# Patient Record
Sex: Female | Born: 1980
Health system: Southern US, Community
[De-identification: ages and names within clinical notes are randomized; demographics above are authoritative.]

## PROBLEM LIST (undated history)

## (undated) DIAGNOSIS — R0789 Other chest pain: Secondary | ICD-10-CM

## (undated) DIAGNOSIS — J45909 Unspecified asthma, uncomplicated: Secondary | ICD-10-CM

## (undated) DIAGNOSIS — F419 Anxiety disorder, unspecified: Secondary | ICD-10-CM

## (undated) DIAGNOSIS — M545 Low back pain, unspecified: Secondary | ICD-10-CM

## (undated) DIAGNOSIS — F32A Depression, unspecified: Secondary | ICD-10-CM

## (undated) DIAGNOSIS — M25531 Pain in right wrist: Secondary | ICD-10-CM

## (undated) HISTORY — DX: Unspecified asthma, uncomplicated: J45.909

## (undated) HISTORY — DX: Anxiety disorder, unspecified: F41.9

## (undated) HISTORY — PX: BREAST EXCISIONAL BIOPSY: SUR124

## (undated) HISTORY — PX: BREAST SURGERY: SHX581

---

## 2005-01-13 HISTORY — PX: COLONOSCOPY: SHX174

## 2007-01-14 HISTORY — PX: BREAST EXCISIONAL BIOPSY: SUR124

## 2014-08-06 ENCOUNTER — Other Ambulatory Visit: Payer: Self-pay | Admitting: Emergency Medicine

## 2014-08-06 ENCOUNTER — Ambulatory Visit (INDEPENDENT_AMBULATORY_CARE_PROVIDER_SITE_OTHER): Payer: No Typology Code available for payment source | Admitting: Emergency Medicine

## 2014-08-06 VITALS — BP 108/66 | HR 70 | Temp 98.2°F | Resp 16 | Ht 64.0 in | Wt 119.4 lb

## 2014-08-06 DIAGNOSIS — R42 Dizziness and giddiness: Secondary | ICD-10-CM

## 2014-08-06 LAB — POCT CBC
GRANULOCYTE PERCENT: 67.7 % (ref 37–80)
HCT, POC: 38.5 % (ref 37.7–47.9)
Hemoglobin: 12.7 g/dL (ref 12.2–16.2)
Lymph, poc: 2 (ref 0.6–3.4)
MCH, POC: 30.1 pg (ref 27–31.2)
MCHC: 33 g/dL (ref 31.8–35.4)
MCV: 91.1 fL (ref 80–97)
MID (CBC): 0.5 (ref 0–0.9)
MPV: 7.7 fL (ref 0–99.8)
PLATELET COUNT, POC: 217 10*3/uL (ref 142–424)
POC Granulocyte: 5.2 (ref 2–6.9)
POC LYMPH %: 25.8 % (ref 10–50)
POC MID %: 6.5 % (ref 0–12)
RBC: 4.23 M/uL (ref 4.04–5.48)
RDW, POC: 12.9 %
WBC: 7.7 10*3/uL (ref 4.6–10.2)

## 2014-08-06 LAB — POCT URINE PREGNANCY: PREG TEST UR: NEGATIVE

## 2014-08-06 MED ORDER — MECLIZINE HCL 32 MG PO TABS: 32.0000 mg | ORAL_TABLET | Freq: Three times a day (TID) | ORAL | Status: DC | PRN

## 2014-08-06 MED ORDER — MECLIZINE HCL 25 MG PO TABS: 25.0000 mg | ORAL_TABLET | Freq: Three times a day (TID) | ORAL | Status: DC | PRN

## 2014-08-06 NOTE — Patient Instructions (Signed)

## 2014-08-06 NOTE — Progress Notes (Signed)
Subjective:    Patient ID: Lauren Castro, female    DOB: [birth year], 34 y.o.   MRN: [id number]  HPI Patient presents for dizziness that has been present since 12:00pm today and happened earlier when she first woke up. Resolved after one hour this morning, but this time won't go away. Feels off-balance and denies HA, change in vision, sinus/ear pressure, SOB, CP, palpitation. Denies trauma or recent illness. No h/o HTN, DM, migraines, seizures, or anemia. H/o asthma. Had syncopal episode 2 weeks ago following eating really rich foods. Does not believe she could be present as her and her husband use condoms. Does not smoke cigarettes or use illicit drugs. Drinks alcohol occasionally. NKDA.   Review of Systems As noted above.    Objective:   Physical Exam  Constitutional: She is oriented to person, place, and time. She appears well-developed and well-nourished. No distress.  Blood pressure 108/66, pulse 53, temperature 98.2 F (36.8 C), temperature source Oral, resp. rate 16, height  (1.626 m), weight 119 lb 6.4 oz (54.159 kg), last menstrual period 08/04/2014, SpO2 99 %.   HENT:  Head: Normocephalic and atraumatic.  Right Ear: External ear normal.  Left Ear: External ear normal.  Eyes: Conjunctivae are normal. Right eye exhibits no discharge. Left eye exhibits no discharge. No scleral icterus.  Cardiovascular: Normal rate, regular rhythm, normal heart sounds and intact distal pulses.  Exam reveals no gallop and no friction rub.   No murmur heard. Pulmonary/Chest: Effort normal and breath sounds normal. No respiratory distress. She has no wheezes. She has no rales.  Abdominal: Soft. Bowel sounds are normal. She exhibits no distension. There is no tenderness. There is no rebound and no guarding.  Musculoskeletal: She exhibits no edema.  Neurological: She is alert and oriented to person, place, and time.  Negative Dix-Halpike  Skin: Skin is warm and dry. No rash noted. She is not  diaphoretic. No erythema. No pallor.  Psychiatric: She has a normal mood and affect. Her behavior is normal. Judgment and thought content normal.   Results for orders placed or performed in visit on 08/06/14  POCT CBC  Result Value Ref Range   WBC 7.7 4.6 - 10.2 K/uL   Lymph, poc 2.0 0.6 - 3.4   POC LYMPH PERCENT 25.8 10 - 50 %L   MID (cbc) 0.5 0 - 0.9   POC MID % 6.5 0 - 12 %M   POC Granulocyte 5.2 2 - 6.9   Granulocyte percent 67.7 37 - 80 %G   RBC 4.23 4.04 - 5.48 M/uL   Hemoglobin 12.7 12.2 - 16.2 g/dL   HCT, POC 40.9 81.1 - 47.9 %   MCV 91.1 80 - 97 fL   MCH, POC 30.1 27 - 31.2 pg   MCHC 33.0 31.8 - 35.4 g/dL   RDW, POC 91.4 %   Platelet Count, POC 217 142 - 424 K/uL   MPV 7.7 0 - 99.8 fL  POCT urine pregnancy  Result Value Ref Range   Preg Test, Ur Negative Negative   EKG with Dr. Cleta Alberts: sinus bradycardia.     Assessment & Plan:  1. Dizziness Labs and EKG reassuring. If not findings with cardiology and sx persist can send for neuro eval. - POCT CBC - POCT urine pregnancy - Comprehensive metabolic panel - EKG 12-Lead - meclizine (ANTIVERT) 32 MG tablet; Take 1 tablet (32 mg total) by mouth 3 (three) times daily as needed.  Dispense: 30 tablet; Refill: 0 - Ambulatory referral  to Cardiology   Janan Ridge PA-C  [facility] 08/06/2014 4:28 PM

## 2014-08-07 ENCOUNTER — Telehealth: Payer: Self-pay | Admitting: Physician Assistant

## 2014-08-07 LAB — COMPREHENSIVE METABOLIC PANEL
ALT: 11 U/L (ref 6–29)
AST: 16 U/L (ref 10–30)
Albumin: 4.6 g/dL (ref 3.6–5.1)
Alkaline Phosphatase: 44 U/L (ref 33–115)
BILIRUBIN TOTAL: 0.4 mg/dL (ref 0.2–1.2)
BUN: 14 mg/dL (ref 7–25)
CO2: 27 mEq/L (ref 20–31)
CREATININE: 0.71 mg/dL (ref 0.50–1.10)
Calcium: 9.3 mg/dL (ref 8.6–10.2)
Chloride: 100 mEq/L (ref 98–110)
Glucose, Bld: 90 mg/dL (ref 65–99)
Potassium: 3.9 mEq/L (ref 3.5–5.3)
Sodium: 137 mEq/L (ref 135–146)
TOTAL PROTEIN: 7.4 g/dL (ref 6.1–8.1)

## 2014-08-07 NOTE — Telephone Encounter (Signed)
Left vmail to check on sx and to relay normal lab values.

## 2014-09-29 ENCOUNTER — Ambulatory Visit: Payer: Self-pay | Admitting: Internal Medicine

## 2015-06-15 DIAGNOSIS — M9901 Segmental and somatic dysfunction of cervical region: Secondary | ICD-10-CM | POA: Diagnosis not present

## 2015-06-15 DIAGNOSIS — M9902 Segmental and somatic dysfunction of thoracic region: Secondary | ICD-10-CM | POA: Diagnosis not present

## 2015-06-15 DIAGNOSIS — M7541 Impingement syndrome of right shoulder: Secondary | ICD-10-CM | POA: Diagnosis not present

## 2015-06-15 DIAGNOSIS — R51 Headache: Secondary | ICD-10-CM | POA: Diagnosis not present

## 2015-06-15 DIAGNOSIS — M542 Cervicalgia: Secondary | ICD-10-CM | POA: Diagnosis not present

## 2015-06-15 DIAGNOSIS — M9907 Segmental and somatic dysfunction of upper extremity: Secondary | ICD-10-CM | POA: Diagnosis not present

## 2015-06-29 DIAGNOSIS — M9901 Segmental and somatic dysfunction of cervical region: Secondary | ICD-10-CM | POA: Diagnosis not present

## 2015-06-29 DIAGNOSIS — M9902 Segmental and somatic dysfunction of thoracic region: Secondary | ICD-10-CM | POA: Diagnosis not present

## 2015-06-29 DIAGNOSIS — R51 Headache: Secondary | ICD-10-CM | POA: Diagnosis not present

## 2015-06-29 DIAGNOSIS — M7541 Impingement syndrome of right shoulder: Secondary | ICD-10-CM | POA: Diagnosis not present

## 2015-06-29 DIAGNOSIS — M542 Cervicalgia: Secondary | ICD-10-CM | POA: Diagnosis not present

## 2015-07-06 DIAGNOSIS — Z13220 Encounter for screening for lipoid disorders: Secondary | ICD-10-CM | POA: Diagnosis not present

## 2015-07-06 DIAGNOSIS — Z0000 Encounter for general adult medical examination without abnormal findings: Secondary | ICD-10-CM | POA: Diagnosis not present

## 2015-07-06 DIAGNOSIS — Z131 Encounter for screening for diabetes mellitus: Secondary | ICD-10-CM | POA: Diagnosis not present

## 2015-08-03 DIAGNOSIS — M7541 Impingement syndrome of right shoulder: Secondary | ICD-10-CM | POA: Diagnosis not present

## 2015-08-03 DIAGNOSIS — M9901 Segmental and somatic dysfunction of cervical region: Secondary | ICD-10-CM | POA: Diagnosis not present

## 2015-08-03 DIAGNOSIS — M542 Cervicalgia: Secondary | ICD-10-CM | POA: Diagnosis not present

## 2015-08-03 DIAGNOSIS — R51 Headache: Secondary | ICD-10-CM | POA: Diagnosis not present

## 2015-09-07 DIAGNOSIS — M7541 Impingement syndrome of right shoulder: Secondary | ICD-10-CM | POA: Diagnosis not present

## 2015-09-07 DIAGNOSIS — M9902 Segmental and somatic dysfunction of thoracic region: Secondary | ICD-10-CM | POA: Diagnosis not present

## 2015-09-07 DIAGNOSIS — M9901 Segmental and somatic dysfunction of cervical region: Secondary | ICD-10-CM | POA: Diagnosis not present

## 2015-09-07 DIAGNOSIS — M542 Cervicalgia: Secondary | ICD-10-CM | POA: Diagnosis not present

## 2015-09-07 DIAGNOSIS — R51 Headache: Secondary | ICD-10-CM | POA: Diagnosis not present

## 2015-10-12 DIAGNOSIS — M7541 Impingement syndrome of right shoulder: Secondary | ICD-10-CM | POA: Diagnosis not present

## 2015-10-12 DIAGNOSIS — M542 Cervicalgia: Secondary | ICD-10-CM | POA: Diagnosis not present

## 2015-10-12 DIAGNOSIS — M9902 Segmental and somatic dysfunction of thoracic region: Secondary | ICD-10-CM | POA: Diagnosis not present

## 2015-10-12 DIAGNOSIS — R51 Headache: Secondary | ICD-10-CM | POA: Diagnosis not present

## 2015-10-12 DIAGNOSIS — M9901 Segmental and somatic dysfunction of cervical region: Secondary | ICD-10-CM | POA: Diagnosis not present

## 2016-01-21 DIAGNOSIS — M9905 Segmental and somatic dysfunction of pelvic region: Secondary | ICD-10-CM | POA: Diagnosis not present

## 2016-01-21 DIAGNOSIS — M9903 Segmental and somatic dysfunction of lumbar region: Secondary | ICD-10-CM | POA: Diagnosis not present

## 2016-01-21 DIAGNOSIS — M62830 Muscle spasm of back: Secondary | ICD-10-CM | POA: Diagnosis not present

## 2016-01-21 DIAGNOSIS — M9902 Segmental and somatic dysfunction of thoracic region: Secondary | ICD-10-CM | POA: Diagnosis not present

## 2016-01-21 DIAGNOSIS — M791 Myalgia: Secondary | ICD-10-CM | POA: Diagnosis not present

## 2016-01-28 DIAGNOSIS — M9902 Segmental and somatic dysfunction of thoracic region: Secondary | ICD-10-CM | POA: Diagnosis not present

## 2016-01-28 DIAGNOSIS — M9903 Segmental and somatic dysfunction of lumbar region: Secondary | ICD-10-CM | POA: Diagnosis not present

## 2016-01-28 DIAGNOSIS — M791 Myalgia: Secondary | ICD-10-CM | POA: Diagnosis not present

## 2016-01-28 DIAGNOSIS — M62830 Muscle spasm of back: Secondary | ICD-10-CM | POA: Diagnosis not present

## 2016-01-28 DIAGNOSIS — M9905 Segmental and somatic dysfunction of pelvic region: Secondary | ICD-10-CM | POA: Diagnosis not present

## 2016-02-06 DIAGNOSIS — M9902 Segmental and somatic dysfunction of thoracic region: Secondary | ICD-10-CM | POA: Diagnosis not present

## 2016-02-06 DIAGNOSIS — M9903 Segmental and somatic dysfunction of lumbar region: Secondary | ICD-10-CM | POA: Diagnosis not present

## 2016-02-06 DIAGNOSIS — M9905 Segmental and somatic dysfunction of pelvic region: Secondary | ICD-10-CM | POA: Diagnosis not present

## 2016-02-06 DIAGNOSIS — M791 Myalgia: Secondary | ICD-10-CM | POA: Diagnosis not present

## 2016-02-06 DIAGNOSIS — M62830 Muscle spasm of back: Secondary | ICD-10-CM | POA: Diagnosis not present

## 2016-02-27 DIAGNOSIS — M791 Myalgia: Secondary | ICD-10-CM | POA: Diagnosis not present

## 2016-02-27 DIAGNOSIS — M9902 Segmental and somatic dysfunction of thoracic region: Secondary | ICD-10-CM | POA: Diagnosis not present

## 2016-02-27 DIAGNOSIS — M9905 Segmental and somatic dysfunction of pelvic region: Secondary | ICD-10-CM | POA: Diagnosis not present

## 2016-02-27 DIAGNOSIS — M62830 Muscle spasm of back: Secondary | ICD-10-CM | POA: Diagnosis not present

## 2016-02-27 DIAGNOSIS — M9903 Segmental and somatic dysfunction of lumbar region: Secondary | ICD-10-CM | POA: Diagnosis not present

## 2016-03-27 DIAGNOSIS — H8309 Labyrinthitis, unspecified ear: Secondary | ICD-10-CM | POA: Diagnosis not present

## 2016-04-02 DIAGNOSIS — R42 Dizziness and giddiness: Secondary | ICD-10-CM | POA: Diagnosis not present

## 2016-04-22 ENCOUNTER — Ambulatory Visit (INDEPENDENT_AMBULATORY_CARE_PROVIDER_SITE_OTHER): Payer: BLUE CROSS/BLUE SHIELD | Admitting: Neurology

## 2016-04-22 ENCOUNTER — Encounter (INDEPENDENT_AMBULATORY_CARE_PROVIDER_SITE_OTHER): Payer: Self-pay

## 2016-04-22 ENCOUNTER — Encounter: Payer: Self-pay | Admitting: Neurology

## 2016-04-22 VITALS — BP 114/77 | HR 72 | Ht 63.5 in | Wt 119.2 lb

## 2016-04-22 DIAGNOSIS — R42 Dizziness and giddiness: Secondary | ICD-10-CM | POA: Diagnosis not present

## 2016-04-22 DIAGNOSIS — H8309 Labyrinthitis, unspecified ear: Secondary | ICD-10-CM | POA: Diagnosis not present

## 2016-04-22 MED ORDER — PREDNISONE 20 MG PO TABS: 60.0000 mg | ORAL_TABLET | Freq: Every day | ORAL | 1 refills | Status: DC

## 2016-04-22 NOTE — Patient Instructions (Signed)
Remember to drink plenty of fluid, eat healthy meals and do not skip any meals. Try to eat protein with a every meal and eat a healthy snack such as fruit or nuts in between meals. Try to keep a regular sleep-wake schedule and try to exercise daily, particularly in the form of walking, 20-30 minutes a day, if you can.   As far as your medications are concerned, I would like to suggest:  Prednisone for 5-7 days Vestibular therapy Epley Maneuvers daily  As far as diagnostic testing: Consider MRI brain if you do not improve  I would like to see you back if needed, sooner if we need to. Please call us with any interim questions, concerns, problems, updates or refill requests.   Our phone number is [phone]. We also have an after hours call service for urgent matters and there is a physician on-call for urgent questions. For any emergencies you know to call 911 or go to the nearest emergency room

## 2016-04-22 NOTE — Progress Notes (Signed)
[facility]    Provider:  Dr Lucia Gaskins Referring Provider: Farris Has, MD Primary Care Physician:  Farris Has, MD  CC: Dizziness, vertigo  HPI:  Lauren Castro is a 36 y.o. female here as a referral from Dr. Kateri Plummer for dizziness, vertigo and nausea. Started a month ago. This happened in 2012 as well. In 2012 she noticed when she turned left that she felt slight dizziness and the next morning she had room spinning and she couldn't focus or keep her balance with nausea. This time started about a month ago, not as bad as bad as 2012, no room spinning an off balance feeling. She has a floating feeling. Like on boat. Worse with motion. Worse with a sudden turn to either side. A crowded store and lights made her dizzy. Feeling hot may trigger it. Breathing deeply helps. Sitting still will help. Frequent changes of direction worsen it. Episodic daily, lasts a minute. She was not feeling well a week before.   Reviewed notes, labs and imaging from outside physicians, which showed:  Review primary care notes. Patient complaining of vertigo. Seen 1 week ago by coworker tried meclizine for 2 days which helped with the nausea but made her sleepy. Stop the medication after the nausea resolved. Vertigo overtime as gotten better but still present. Started several weeks ago with feeling off balance at times not a spinning sensation more of a balance or delays aggravated by motion, physical tasks. No vomiting or numbness or weakness. No vertigo twice in the past. Worst time was initially 02/01/2010. No chest pain or dyspnea. Exam was normal.   Reviewed labs including BMP and CBC were both normal with creatinine 0.72 and BUN 14.  Review of Systems: Patient complains of symptoms per HPI as well as the following symptoms: no CP, no SOB. Pertinent negatives per HPI. All others negative.   Social History   Social History  . Marital status: Married    Spouse name: N/A  . Number of children: 0    . Years of education: MA   Occupational History  . [employer]    Social History Main Topics  . Smoking status: Never Smoker  . Smokeless tobacco: Never Used  . Alcohol use 3.0 - 4.2 oz/week    5 - 7 Glasses of wine per week     Comment: 1 glass of wine daily  . Drug use: No  . Sexual activity: Yes    Birth control/ protection: Condom   Other Topics Concern  . Not on file   Social History Narrative   Lives at home w/ her husband   Right-handed   Caffeine: 1-2 cups per day    Family History  Problem Relation Age of Onset  . Stroke Neg Hx     Past Medical History:  Diagnosis Date  . Anxiety   . Asthma     Past Surgical History:  Procedure Laterality Date  . BREAST SURGERY      Current Outpatient Prescriptions  Medication Sig Dispense Refill  . ibuprofen (ADVIL,MOTRIN) 200 MG tablet Take 400 mg by mouth every 8 (eight) hours as needed.    . predniSONE (DELTASONE) 20 MG tablet Take 3 tablets (60 mg total) by mouth daily. 21 tablet 1   No current facility-administered medications for this visit.     Allergies as of 04/22/2016  . (No Known Allergies)    Vitals: BP 114/77 (Patient Position: Standing)   Pulse 72   Ht 5' 3.5" (1.613 m)  Wt 119 lb 3.2 oz (54.1 kg)   BMI 20.78 kg/m  Last Weight:  Wt Readings from Last 1 Encounters:  04/22/16 119 lb 3.2 oz (54.1 kg)   Last Height:   Ht Readings from Last 1 Encounters:  04/22/16 5' 3.5" (1.613 m)    Physical exam: Exam: Gen: NAD, conversant, well nourised, well groomed                     CV: RRR, no MRG. No Carotid Bruits. No peripheral edema, warm, nontender Eyes: Conjunctivae clear without exudates or hemorrhage  Neuro: Detailed Neurologic Exam  Speech:    Speech is normal; fluent and spontaneous with normal comprehension.  Cognition:    The patient is oriented to person, place, and time;     recent and remote memory intact;     language fluent;     normal attention, concentration,     fund of  knowledge Cranial Nerves:    The pupils are equal, round, and reactive to light. The fundi are normal and spontaneous venous pulsations are present. Visual fields are full to finger confrontation. Extraocular movements are intact. Trigeminal sensation is intact and the muscles of mastication are normal. The face is symmetric. The palate elevates in the midline. Hearing intact. Voice is normal. Shoulder shrug is normal. The tongue has normal motion without fasciculations.   Coordination:    Normal finger to nose and heel to shin. Normal rapid alternating movements.   Gait:    Heel-toe and tandem gait are normal.   Motor Observation:    No asymmetry, no atrophy, and no involuntary movements noted. Tone:    Normal muscle tone.    Posture:    Posture is normal. normal erect    Strength:    Strength is V/V in the upper and lower limbs.      Sensation: intact to LT     Reflex Exam:  DTR's:    Deep tendon reflexes in the upper and lower extremities are normal bilaterally.   Toes:    The toes are downgoing bilaterally.   Clonus:    Clonus is absent.      Assessment/Plan:  Very lovely 36 year old with vertigo and dizziness. May be BPPV vs Vestibular Neuritis. Will give her a course of steroids and refer to Vestibular therapy. Recommended MRI but she declined, she will call back if symptoms do not improve.    prednisone x 7 Vestibular therapy Exercises at home, Epley maneuvers  Cc: Dr. Grafton Folk, MD  [facility] [address] [place], [place] [address]  Phone [phone] Fax [phone]

## 2016-04-24 ENCOUNTER — Encounter: Payer: Self-pay | Admitting: Neurology

## 2016-09-25 ENCOUNTER — Other Ambulatory Visit: Payer: Self-pay | Admitting: Family Medicine

## 2016-09-25 ENCOUNTER — Other Ambulatory Visit (HOSPITAL_COMMUNITY)
Admission: RE | Admit: 2016-09-25 | Discharge: 2016-09-25 | Disposition: A | Discharge status: Home / Self Care | Payer: BLUE CROSS/BLUE SHIELD | Source: Ambulatory Visit | Attending: Family Medicine | Admitting: Family Medicine

## 2016-09-25 DIAGNOSIS — Z124 Encounter for screening for malignant neoplasm of cervix: Secondary | ICD-10-CM | POA: Diagnosis not present

## 2016-09-25 DIAGNOSIS — Z01419 Encounter for gynecological examination (general) (routine) without abnormal findings: Secondary | ICD-10-CM | POA: Insufficient documentation

## 2016-09-25 DIAGNOSIS — Z23 Encounter for immunization: Secondary | ICD-10-CM | POA: Diagnosis not present

## 2016-09-25 DIAGNOSIS — Z0000 Encounter for general adult medical examination without abnormal findings: Secondary | ICD-10-CM | POA: Diagnosis not present

## 2016-09-26 LAB — CYTOLOGY - PAP: DIAGNOSIS: NEGATIVE

## 2016-10-03 DIAGNOSIS — M62830 Muscle spasm of back: Secondary | ICD-10-CM | POA: Diagnosis not present

## 2016-10-03 DIAGNOSIS — M9905 Segmental and somatic dysfunction of pelvic region: Secondary | ICD-10-CM | POA: Diagnosis not present

## 2016-10-03 DIAGNOSIS — M9902 Segmental and somatic dysfunction of thoracic region: Secondary | ICD-10-CM | POA: Diagnosis not present

## 2016-10-03 DIAGNOSIS — M9903 Segmental and somatic dysfunction of lumbar region: Secondary | ICD-10-CM | POA: Diagnosis not present

## 2016-10-03 DIAGNOSIS — M791 Myalgia: Secondary | ICD-10-CM | POA: Diagnosis not present

## 2016-11-07 DIAGNOSIS — M791 Myalgia, unspecified site: Secondary | ICD-10-CM | POA: Diagnosis not present

## 2016-11-07 DIAGNOSIS — M9905 Segmental and somatic dysfunction of pelvic region: Secondary | ICD-10-CM | POA: Diagnosis not present

## 2016-11-07 DIAGNOSIS — M9902 Segmental and somatic dysfunction of thoracic region: Secondary | ICD-10-CM | POA: Diagnosis not present

## 2016-11-07 DIAGNOSIS — M62830 Muscle spasm of back: Secondary | ICD-10-CM | POA: Diagnosis not present

## 2016-11-07 DIAGNOSIS — M9903 Segmental and somatic dysfunction of lumbar region: Secondary | ICD-10-CM | POA: Diagnosis not present

## 2016-12-12 DIAGNOSIS — M9905 Segmental and somatic dysfunction of pelvic region: Secondary | ICD-10-CM | POA: Diagnosis not present

## 2016-12-12 DIAGNOSIS — M9903 Segmental and somatic dysfunction of lumbar region: Secondary | ICD-10-CM | POA: Diagnosis not present

## 2016-12-12 DIAGNOSIS — M9902 Segmental and somatic dysfunction of thoracic region: Secondary | ICD-10-CM | POA: Diagnosis not present

## 2016-12-12 DIAGNOSIS — M62830 Muscle spasm of back: Secondary | ICD-10-CM | POA: Diagnosis not present

## 2016-12-12 DIAGNOSIS — M791 Myalgia, unspecified site: Secondary | ICD-10-CM | POA: Diagnosis not present

## 2017-01-16 DIAGNOSIS — M9905 Segmental and somatic dysfunction of pelvic region: Secondary | ICD-10-CM | POA: Diagnosis not present

## 2017-01-16 DIAGNOSIS — M9902 Segmental and somatic dysfunction of thoracic region: Secondary | ICD-10-CM | POA: Diagnosis not present

## 2017-01-16 DIAGNOSIS — M791 Myalgia, unspecified site: Secondary | ICD-10-CM | POA: Diagnosis not present

## 2017-01-16 DIAGNOSIS — M62830 Muscle spasm of back: Secondary | ICD-10-CM | POA: Diagnosis not present

## 2017-01-16 DIAGNOSIS — M9903 Segmental and somatic dysfunction of lumbar region: Secondary | ICD-10-CM | POA: Diagnosis not present

## 2017-02-20 DIAGNOSIS — M62830 Muscle spasm of back: Secondary | ICD-10-CM | POA: Diagnosis not present

## 2017-02-20 DIAGNOSIS — M9902 Segmental and somatic dysfunction of thoracic region: Secondary | ICD-10-CM | POA: Diagnosis not present

## 2017-02-20 DIAGNOSIS — M791 Myalgia, unspecified site: Secondary | ICD-10-CM | POA: Diagnosis not present

## 2017-02-20 DIAGNOSIS — M9905 Segmental and somatic dysfunction of pelvic region: Secondary | ICD-10-CM | POA: Diagnosis not present

## 2017-02-20 DIAGNOSIS — M9903 Segmental and somatic dysfunction of lumbar region: Secondary | ICD-10-CM | POA: Diagnosis not present

## 2017-04-01 DIAGNOSIS — R5383 Other fatigue: Secondary | ICD-10-CM | POA: Diagnosis not present

## 2017-04-01 DIAGNOSIS — J069 Acute upper respiratory infection, unspecified: Secondary | ICD-10-CM | POA: Diagnosis not present

## 2017-04-20 DIAGNOSIS — D729 Disorder of white blood cells, unspecified: Secondary | ICD-10-CM | POA: Diagnosis not present

## 2017-09-30 DIAGNOSIS — M25531 Pain in right wrist: Secondary | ICD-10-CM | POA: Diagnosis not present

## 2017-09-30 DIAGNOSIS — Z23 Encounter for immunization: Secondary | ICD-10-CM | POA: Diagnosis not present

## 2017-09-30 DIAGNOSIS — Z0000 Encounter for general adult medical examination without abnormal findings: Secondary | ICD-10-CM | POA: Diagnosis not present

## 2017-09-30 DIAGNOSIS — Z136 Encounter for screening for cardiovascular disorders: Secondary | ICD-10-CM | POA: Diagnosis not present

## 2017-09-30 DIAGNOSIS — D729 Disorder of white blood cells, unspecified: Secondary | ICD-10-CM | POA: Diagnosis not present

## 2017-10-19 DIAGNOSIS — L814 Other melanin hyperpigmentation: Secondary | ICD-10-CM | POA: Diagnosis not present

## 2017-10-19 DIAGNOSIS — B360 Pityriasis versicolor: Secondary | ICD-10-CM | POA: Diagnosis not present

## 2017-10-19 DIAGNOSIS — D225 Melanocytic nevi of trunk: Secondary | ICD-10-CM | POA: Diagnosis not present

## 2017-10-19 DIAGNOSIS — L304 Erythema intertrigo: Secondary | ICD-10-CM | POA: Diagnosis not present

## 2017-12-03 DIAGNOSIS — M25531 Pain in right wrist: Secondary | ICD-10-CM | POA: Diagnosis not present

## 2017-12-14 DIAGNOSIS — M25531 Pain in right wrist: Secondary | ICD-10-CM | POA: Diagnosis not present

## 2017-12-14 DIAGNOSIS — M25331 Other instability, right wrist: Secondary | ICD-10-CM | POA: Diagnosis not present

## 2018-01-11 DIAGNOSIS — M25331 Other instability, right wrist: Secondary | ICD-10-CM | POA: Diagnosis not present

## 2018-02-03 DIAGNOSIS — M25331 Other instability, right wrist: Secondary | ICD-10-CM | POA: Diagnosis not present

## 2018-07-29 DIAGNOSIS — M545 Low back pain: Secondary | ICD-10-CM | POA: Diagnosis not present

## 2018-08-03 ENCOUNTER — Ambulatory Visit: Payer: BC Managed Care – PPO | Attending: Family Medicine

## 2018-08-03 ENCOUNTER — Other Ambulatory Visit: Payer: Self-pay

## 2018-08-03 DIAGNOSIS — M545 Low back pain, unspecified: Secondary | ICD-10-CM

## 2018-08-03 DIAGNOSIS — R252 Cramp and spasm: Secondary | ICD-10-CM

## 2018-08-03 DIAGNOSIS — R293 Abnormal posture: Secondary | ICD-10-CM | POA: Diagnosis not present

## 2018-08-03 DIAGNOSIS — G8929 Other chronic pain: Secondary | ICD-10-CM | POA: Insufficient documentation

## 2018-08-03 NOTE — Patient Instructions (Addendum)
Trigger Point Dry Needling  . What is Trigger Point Dry Needling (DN)? o DN is a physical therapy technique used to treat muscle pain and dysfunction. Specifically, DN helps deactivate muscle trigger points (muscle knots).  o A thin filiform needle is used to penetrate the skin and stimulate the underlying trigger point. The goal is for a local twitch response (LTR) to occur and for the trigger point to relax. No medication of any kind is injected during the procedure.   . What Does Trigger Point Dry Needling Feel Like?  o The procedure feels different for each individual patient. Some patients report that they do not actually feel the needle enter the skin and overall the process is not painful. Very mild bleeding may occur. However, many patients feel a deep cramping in the muscle in which the needle was inserted. This is the local twitch response.   Marland Kitchen How Will I feel after the treatment? o Soreness is normal, and the onset of soreness may not occur for a few hours. Typically this soreness does not last longer than two days.  o Bruising is uncommon, however; ice can be used to decrease any possible bruising.  o In rare cases feeling tired or nauseous after the treatment is normal. In addition, your symptoms may get worse before they get better, this period will typically not last longer than 24 hours.   . What Can I do After My Treatment? o Increase your hydration by drinking more water for the next 24 hours. o You may place ice or heat on the areas treated that have become sore, however, do not use heat on inflamed or bruised areas. Heat often brings more relief post needling. o You can continue your regular activities, but vigorous activity is not recommended initially after the treatment for 24 hours. o DN is best combined with other physical therapy such as strengthening, stretching, and other therapies.   Cervico-Thoracic: Extension / Rotation (Sitting)    Reach across body with left  arm and grasp back of chair. Gently look over right side shoulder. Hold ___20_ seconds. Relax. Repeat _3___ times per set. Do ____ sets per session. Do __3__ sessions per day.  [email or web address]   Access Code: [email or web address]  URL: [email or web address]  Date: 08/03/2018  Prepared by: Sigurd Sos   Exercises  Supine Lower Trunk Rotation - 3 reps - 1 sets - 20 hold - 3x daily - 7x weekly  Sidelying Open Book Thoracic Rotation with Knee on Foam Roll - 10 reps - 1 sets - 5 hold - 3x daily - 7x weekly  Supine Piriformis Stretch - 3 reps - 1 sets - 3x daily - 7x weekly  Seated Piriformis Stretch with Trunk Bend - 3 reps - 1 sets - 20 hold - 3x daily - 7x weekly  Patient Education  Office Posture   Copyright  VHI. All rights reserved.  [facility] [address], [place], [place] [address] Phone # [phone] Fax [phone]

## 2018-08-03 NOTE — Therapy (Signed)
[facility] [address] [place], [place], [address] Phone: [phone]   Fax:  [phone]  Physical Therapy Evaluation  Patient Details  Name: Lauren Castro MRN: [id number] Date of Birth: May 26, 1980 Referring Provider (PT): London Pepper, MD   Encounter Date: 08/03/2018  PT End of Session - 08/03/18 0820    Visit Number  1    Date for PT Re-Evaluation  09/28/18    Authorization Type  BCBS    PT Start Time  0731    PT Stop Time  0823    PT Time Calculation (min)  52 min    Activity Tolerance  Patient tolerated treatment well    Behavior During Therapy  [facility] for tasks assessed/performed       Past Medical History:  Diagnosis Date  . Anxiety   . Asthma     Past Surgical History:  Procedure Laterality Date  . BREAST SURGERY      There were no vitals filed for this visit.   Subjective Assessment - 08/03/18 0735    Subjective  Pt presents to PT with Rt sided LBP that began in April when she was moving heavy rocks.  Pain resolved and then returned in June after twisting during exercise.    Limitations  Sitting;Standing    How long can you sit comfortably?  on a hard chair- limited to 2-3 hours    How long can you stand comfortably?  1-2 hours    How long can you walk comfortably?  some pain, not limited    Diagnostic tests  none    Patient Stated Goals  reduce pain, stand and sit longer, run again    Currently in Pain?  Yes    Pain Score  4    4-5/10 max   Pain Location  Back    Pain Orientation  Right    Pain Descriptors / Indicators  Sharp;Sore    Pain Type  Acute pain    Pain Onset  More than a month ago    Pain Frequency  Constant    Aggravating Factors   sitting, standing, exercise    Pain Relieving Factors  stop the aggravating activity, stretching         [facility] PT Assessment - 08/03/18 0001      Assessment   Medical Diagnosis  Rt low back pain    Referring Provider (PT)  London Pepper, MD     Onset Date/Surgical Date  04/20/18    Prior Therapy  none      Precautions   Precautions  None      Restrictions   Weight Bearing Restrictions  No      Balance Screen   Has the patient fallen in the past 6 months  No    Has the patient had a decrease in activity level because of a fear of falling?   No    Is the patient reluctant to leave their home because of a fear of falling?   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  Pt reports Rt lumbar pain with Rt sidebending.       PROM   Overall PROM   Within functional limits for tasks performed    Overall PROM Comments  no pain      Strength   Overall Strength  Deficits    Overall Strength Comments  hip and lumbar strength is full except hip flexion is 4/5 bilaterally      Palpation   Spinal mobility  reduced PA mobility T10-12, L3-5 with pain L3-5    Palpation comment  tension and trigger points Rt quadratus, tension over Rt lumbar paraspinals      Transfers   Transfers  Independent with all Transfers      Ambulation/Gait   Ambulation/Gait  Yes    Gait Pattern  Within Functional Limits                Objective measurements  completed on examination: See above findings.        Trigger Point Dry Needling - 08/03/18 0001    Consent Given?  Yes    Education Handout Provided  Yes    Muscles Treated Back/Hip  Lumbar multifidi   bil L1-5   Lumbar multifidi Response  Twitch response elicited;Palpable increased muscle length           PT Education - 08/03/18 0837    Education Details  Access Code: [email or web address] , Dry needling information    Person(s) Educated  Patient    Methods  Explanation;Demonstration;Handout    Comprehension  Verbalized understanding;Returned demonstration       PT Short Term Goals - 08/03/18 0743      PT SHORT TERM GOAL #1   Title  be independent in initial HEP    Time  4    Period  Weeks    Status  New    Target Date  08/31/18      PT SHORT TERM GOAL #2   Title  report < or = to 2/10 LBP with sitting and standing for work    Time  4    Period  Weeks    Status  New    Target Date  08/31/18      PT SHORT TERM GOAL #3   Title  report frequent change of postion and postural corrections with desk work    Time  4    Period  Weeks    Status  New    Target Date  08/31/18        PT Long Term Goals - 08/03/18 0834      PT LONG TERM GOAL #1   Title  be independent in advanced HEP    Time  8    Period  Weeks    Status  New    Target Date  09/28/18      PT LONG TERM GOAL #2   Title  reduce FOTO to <  or = to 21% limitation    Time  8    Period  Weeks    Status  New    Target Date  09/28/18      PT LONG TERM GOAL #3   Title  return to regular exercise routine without limitation due to LBP    Time  8    Period  Weeks    Status  New    Target Date  09/28/18      PT LONG TERM GOAL #4   Title  return to regular running without increased LBP    Time  8    Period  Weeks    Status  New    Target Date  09/28/18      PT LONG TERM GOAL #5   Title  report a 75% reduction in LBP with work tasks    Time  8    Period  Weeks    Status  New    Target Date  09/28/18              Plan - 08/03/18 0854    Clinical Impression Statement  Pt presents to PT with Rt sided lumbar pain that began in April after moving heavy stones in her yard.  This pain resolved and then returned when she was performing a twisting exercise when exercising.  Pt now reports 4-5/10 Rt sided LBP that is present with long periods of standing and sitting.  Pt has not been running due to pain.  Pt demonstrates full lumbar A/ROM with Rt sided lumbar pain with Rt sidebending.  Pt with reduced PA mobility T10-12 and L3-5 with pain at the lumbar segments and Rt paraspinals.  Pt also with tenderness over the Rt quadratus lumborum.  Pt will benefit from skilled PT for dry needling, manual therapy, hip and core strength and flexibility to allow for return to function without pain.    Examination-Activity Limitations  Stand;Sit    Stability/Clinical Decision Making  Stable/Uncomplicated    Clinical Decision Making  Low    Rehab Potential  Excellent    PT Frequency  2x / week    PT Duration  8 weeks    PT Treatment/Interventions  ADLs/Self Care Home Management;Cryotherapy;Electrical Stimulation;Moist Heat;Functional mobility training;Therapeutic activities;Therapeutic exercise;Neuromuscular re-education;Manual techniques;Patient/family education;Passive range of motion;Dry needling;Taping;Spinal Manipulations;Joint Manipulations    PT Next Visit Plan  assess response to dry needling. Repeat and add needling to quadratus.  Core strength, hip strength    PT Home Exercise Plan  Access Code: [email or web address]       Patient will benefit from skilled therapeutic intervention in order to improve the following deficits and impairments:  Decreased activity tolerance, Decreased strength, Impaired flexibility, Increased muscle spasms, Improper body mechanics, Postural dysfunction  Visit Diagnosis: 1. Chronic right-sided low back pain without sciatica   2. Abnormal posture   3. Cramp and spasm        Problem  List There are no active problems to display for this patient.   Lauren Castro, PT 08/03/18 9:08 AM  [facility] [address] [place], [place], [address] Phone: [phone]   Fax:  [phone]  Name: Lauren Castro MRN: [id number] Date of Birth: 04/22/1980

## 2018-08-16 ENCOUNTER — Other Ambulatory Visit: Payer: Self-pay

## 2018-08-16 ENCOUNTER — Ambulatory Visit: Payer: BC Managed Care – PPO | Attending: Family Medicine

## 2018-08-16 DIAGNOSIS — G8929 Other chronic pain: Secondary | ICD-10-CM | POA: Diagnosis not present

## 2018-08-16 DIAGNOSIS — R293 Abnormal posture: Secondary | ICD-10-CM | POA: Insufficient documentation

## 2018-08-16 DIAGNOSIS — M545 Low back pain, unspecified: Secondary | ICD-10-CM

## 2018-08-16 DIAGNOSIS — R252 Cramp and spasm: Secondary | ICD-10-CM | POA: Insufficient documentation

## 2018-08-16 NOTE — Patient Instructions (Signed)
Access Code: [email or web address]  URL: [email or web address]  Date: 08/16/2018  Prepared by: Sigurd Sos   Exercises  Bridge with Abduction and Resistance Loop - 10 reps - 1x daily - 7x weekly Clamshell with Resistance - 10 reps - 2 sets - 1x daily - 7x weekly Patient Education Office Posture

## 2018-08-16 NOTE — Therapy (Addendum)
[facility] [address] [place], [place], [address] Phone: [phone]   Fax:  [phone]  Physical Therapy Treatment  Patient Details  Name: Lauren Castro MRN: [id number] Date of Birth: Jul 15, 1980 Referring Provider (PT): London Pepper, MD   Encounter Date: 08/16/2018  PT End of Session - 08/16/18 1528    Visit Number  2    Date for PT Re-Evaluation  09/28/18    Authorization Type  BCBS    PT Start Time  1446    PT Stop Time  1526    PT Time Calculation (min)  40 min    Activity Tolerance  Patient tolerated treatment well    Behavior During Therapy  [facility] for tasks assessed/performed       Past Medical History:  Diagnosis Date  . Anxiety   . Asthma     Past Surgical History:  Procedure Laterality Date  . BREAST SURGERY      There were no vitals filed for this visit.  Subjective Assessment - 08/16/18 1447    Subjective  I did my homework everyday for a week. The needling helped the low back.  Still with Rt>Lt gluteal and low back pain.    Patient Stated Goals  reduce pain, stand and sit longer, run again    Currently in Pain?  Yes    Pain Score  3     Pain Location  Back    Pain Orientation  Right;Left    Pain Descriptors / Indicators  Tightness;Sore    Pain Type  Acute pain    Pain Onset  More than a month ago    Pain Frequency  Constant    Aggravating Factors   being too still, exercise    Pain Relieving Factors  stetching, movement                       [facility] Adult PT Treatment/Exercise - 08/16/18 0001      Exercises   Exercises  Lumbar;Knee/Hip      Lumbar Exercises: Stretches   Lower Trunk Rotation  3 reps;20 seconds    Piriformis Stretch  Left;Right;20 seconds;2 reps   seated and supine   Other Lumbar Stretch Exercise  seated lumbar rotation 3x20 seconds      Lumbar Exercises: Supine   Bridge with clamshell  20 reps;5 seconds    Bridge with [employer] Limitations  green  band      Lumbar Exercises: Sidelying   Clam  Both;20 reps    Clam Limitations  green band      Manual Therapy   Manual Therapy  Soft tissue mobilization    Manual therapy comments  Rt gluteals, quadratus and paraspinals       Trigger Point Dry Needling - 08/16/18 0001    Consent Given?  Yes    Muscles Treated Back/Hip  Lumbar multifidi;Gluteus minimus;Gluteus medius;Quadratus lumborum   bil L1-5. Rt only   Gluteus Minimus Response  Twitch response elicited;Palpable increased muscle length    Gluteus Medius Response  Twitch response elicited;Palpable increased muscle length    Lumbar multifidi Response  Twitch response elicited;Palpable increased muscle length    Quadratus Lumborum Response  Twitch response elicited;Palpable increased muscle length   Rt only          PT Education - 08/16/18 1507    Education Details  Access Code: [email or web address]    Person(s) Educated  Patient    Methods  Explanation;Demonstration;Handout  Comprehension  Returned demonstration;Verbalized understanding       PT Short Term Goals - 08/03/18 0743      PT SHORT TERM GOAL #1   Title  be independent in initial HEP    Time  4    Period  Weeks    Status  New    Target Date  08/31/18      PT SHORT TERM GOAL #2   Title  report < or = to 2/10 LBP with sitting and standing for work    Time  4    Period  Weeks    Status  New    Target Date  08/31/18      PT SHORT TERM GOAL #3   Title  report frequent change of postion and postural corrections with desk work    Time  4    Period  Weeks    Status  New    Target Date  08/31/18        PT Long Term Goals - 08/03/18 0834      PT LONG TERM GOAL #1   Title  be independent in advanced HEP    Time  8    Period  Weeks    Status  New    Target Date  09/28/18      PT LONG TERM GOAL #2   Title  reduce FOTO to < or = to 21% limitation    Time  8    Period  Weeks    Status  New    Target Date  09/28/18      PT LONG TERM GOAL #3   Title   return to regular exercise routine without limitation due to LBP    Time  8    Period  Weeks    Status  New    Target Date  09/28/18      PT LONG TERM GOAL #4   Title  return to regular running without increased LBP    Time  8    Period  Weeks    Status  New    Target Date  09/28/18      PT LONG TERM GOAL #5   Title  report a 75% reduction in LBP with work tasks    Time  8    Period  Weeks    Status  New    Target Date  09/28/18            Plan - 08/16/18 1501    Clinical Impression Statement  Pt with first time follow-up today after evaluation 2 weeks ago.  Pt reports 80% reduction in LBP after needling last session.  Pt was consistent in flexibility exercises and then stopped.  Pt with bil gluteal pain today.  PT added hip and core strength exercises today.  Pt with tension and trigger points in bil gluteals and Rt lumbar multifidi/paraspinals and quadratus and demonstrated improved tissue mobility after manual therapy and dry needling.  Pt will continue to benefit from skilled PT to address lumbar and hip pain.    Rehab Potential  Excellent    PT Frequency  2x / week    PT Duration  8 weeks    PT Treatment/Interventions  ADLs/Self Care Home Management;Cryotherapy;Electrical Stimulation;Moist Heat;Functional mobility training;Therapeutic activities;Therapeutic exercise;Neuromuscular re-education;Manual techniques;Patient/family education;Passive range of motion;Dry needling;Taping;Spinal Manipulations;Joint Manipulations    PT Next Visit Plan  assess response to dry needling,   Core strength, hip strength    PT Home Exercise  Plan  Access Code: [email or web address]    Recommended Other Services  initial certification is signed    Consulted and Agree with Plan of Care  Patient       Patient will benefit from skilled therapeutic intervention in order to improve the following deficits and impairments:  Decreased activity tolerance, Decreased strength, Impaired flexibility, Increased muscle  spasms, Improper body mechanics, Postural dysfunction  Visit Diagnosis: 1. Abnormal posture   2. Chronic right-sided low back pain without sciatica   3. Cramp and spasm        Problem List There are no active problems to display for this patient.   Sigurd Sos, PT 08/16/18 3:30 PM PHYSICAL THERAPY DISCHARGE SUMMARY  Visits from Start of Care: 2  Current functional level related to goals / functional outcomes: Pt had to cancel all remaining appointments due to work schedule.     Remaining deficits: See above for current status.     Education / Equipment: HEP, posture/body mechanics Plan: Patient agrees to discharge.  Patient goals were not met. Patient is being discharged due to the patient's request.  ?????        Sigurd Sos, PT 09/29/18 2:10 PM   [facility] [address] [place], [place], [address] Phone: [phone]   Fax:  [phone]  Name: Lauren Castro MRN: [id number] Date of Birth: Mar 09, 1980

## 2018-10-07 ENCOUNTER — Other Ambulatory Visit: Payer: Self-pay | Admitting: Family Medicine

## 2018-10-07 DIAGNOSIS — Z0000 Encounter for general adult medical examination without abnormal findings: Secondary | ICD-10-CM | POA: Diagnosis not present

## 2018-10-07 DIAGNOSIS — Z1231 Encounter for screening mammogram for malignant neoplasm of breast: Secondary | ICD-10-CM

## 2018-10-08 ENCOUNTER — Other Ambulatory Visit: Payer: Self-pay

## 2018-10-08 ENCOUNTER — Ambulatory Visit
Admission: RE | Admit: 2018-10-08 | Discharge: 2018-10-08 | Disposition: A | Discharge status: Home / Self Care | Payer: BC Managed Care – PPO | Source: Ambulatory Visit | Attending: Family Medicine | Admitting: Family Medicine

## 2018-10-08 DIAGNOSIS — Z1231 Encounter for screening mammogram for malignant neoplasm of breast: Secondary | ICD-10-CM | POA: Diagnosis not present

## 2018-10-21 DIAGNOSIS — Z872 Personal history of diseases of the skin and subcutaneous tissue: Secondary | ICD-10-CM | POA: Diagnosis not present

## 2018-10-21 DIAGNOSIS — B078 Other viral warts: Secondary | ICD-10-CM | POA: Diagnosis not present

## 2018-10-21 DIAGNOSIS — L814 Other melanin hyperpigmentation: Secondary | ICD-10-CM | POA: Diagnosis not present

## 2018-10-21 DIAGNOSIS — D225 Melanocytic nevi of trunk: Secondary | ICD-10-CM | POA: Diagnosis not present

## 2019-03-12 ENCOUNTER — Ambulatory Visit: Payer: Self-pay | Attending: Internal Medicine

## 2019-03-12 DIAGNOSIS — Z23 Encounter for immunization: Secondary | ICD-10-CM | POA: Insufficient documentation

## 2019-03-12 NOTE — Progress Notes (Signed)
   Covid-19 Vaccination Clinic  Name:  Lauren Castro    MRN: [id number] DOB: 1980-07-11  03/12/2019  [name] was observed post Covid-19 immunization for 15 minutes without incidence. She was provided with Vaccine Information Sheet and instruction to access the V-Safe system.   [name] was instructed to call 911 with any severe reactions post vaccine: Marland Kitchen Difficulty breathing  . Swelling of your face and throat  . A fast heartbeat  . A bad rash all over your body  . Dizziness and weakness    Immunizations Administered    Name Date Dose VIS Date Route   Pfizer COVID-19 Vaccine 03/12/2019 11:19 AM 0.3 mL 12/24/2018 Intramuscular   Manufacturer: [employer], [employer]   Lot: [id number]   NDC: [address]-3

## 2019-04-02 ENCOUNTER — Ambulatory Visit: Payer: Self-pay | Attending: Internal Medicine

## 2019-04-02 DIAGNOSIS — Z23 Encounter for immunization: Secondary | ICD-10-CM

## 2019-04-02 NOTE — Progress Notes (Signed)
   Covid-19 Vaccination Clinic  Name:  Shelda Truby    MRN: [id number] DOB: June 28, 1980  04/02/2019  Ms. Gershman was observed post Covid-19 immunization for 15 minutes without incident. She was provided with Vaccine Information Sheet and instruction to access the V-Safe system.   Ms. Glotfelty was instructed to call 911 with any severe reactions post vaccine: Marland Kitchen Difficulty breathing  . Swelling of face and throat  . A fast heartbeat  . A bad rash all over body  . Dizziness and weakness   Immunizations Administered    Name Date Dose VIS Date Route   Pfizer COVID-19 Vaccine 04/02/2019 11:48 AM 0.3 mL 12/24/2018 Intramuscular   Manufacturer: [employer], [employer]   Lot: [id number]   NDC: [address]-4

## 2019-04-06 ENCOUNTER — Ambulatory Visit: Payer: Self-pay

## 2019-09-01 ENCOUNTER — Other Ambulatory Visit: Payer: Self-pay | Admitting: Family Medicine

## 2019-09-01 DIAGNOSIS — Z1231 Encounter for screening mammogram for malignant neoplasm of breast: Secondary | ICD-10-CM

## 2019-10-14 ENCOUNTER — Other Ambulatory Visit (HOSPITAL_COMMUNITY)
Admission: RE | Admit: 2019-10-14 | Discharge: 2019-10-14 | Disposition: A | Discharge status: Home / Self Care | Payer: BC Managed Care – PPO | Source: Ambulatory Visit | Attending: Family Medicine | Admitting: Family Medicine

## 2019-10-14 ENCOUNTER — Other Ambulatory Visit: Payer: Self-pay

## 2019-10-14 ENCOUNTER — Ambulatory Visit
Admission: RE | Admit: 2019-10-14 | Discharge: 2019-10-14 | Disposition: A | Discharge status: Home / Self Care | Payer: BC Managed Care – PPO | Source: Ambulatory Visit | Attending: Family Medicine | Admitting: Family Medicine

## 2019-10-14 DIAGNOSIS — Z01411 Encounter for gynecological examination (general) (routine) with abnormal findings: Secondary | ICD-10-CM | POA: Insufficient documentation

## 2019-10-14 DIAGNOSIS — Z1231 Encounter for screening mammogram for malignant neoplasm of breast: Secondary | ICD-10-CM

## 2019-10-17 LAB — CYTOLOGY - PAP
Comment: NEGATIVE
Diagnosis: NEGATIVE
High risk HPV: NEGATIVE

## 2019-12-21 ENCOUNTER — Ambulatory Visit: Payer: BC Managed Care – PPO | Attending: Family Medicine

## 2019-12-21 ENCOUNTER — Other Ambulatory Visit: Payer: Self-pay

## 2019-12-21 DIAGNOSIS — R252 Cramp and spasm: Secondary | ICD-10-CM | POA: Insufficient documentation

## 2019-12-21 DIAGNOSIS — M546 Pain in thoracic spine: Secondary | ICD-10-CM | POA: Diagnosis present

## 2019-12-21 NOTE — Patient Instructions (Signed)
Access Code: [email or web address] URL: [email or web address] Date: 12/21/2019 Prepared by: Tresa Endo  Exercises Cat-Camel to Child's Pose - 2 x daily - 7 x weekly - 1 sets - 10 reps - 5 hold Child's Pose Stretch - 2 x daily - 7 x weekly - 1 sets - 3 reps - 20 hold Child's Pose with Sidebending - 2 x daily - 7 x weekly - 3-4 reps - 20 hold Seated Trunk Rotation - Arms Crossed - 3 x daily - 7 x weekly - 3 reps - 20 hold Standing Shoulder External Rotation with Resistance - 2 x daily - 7 x weekly - 2 sets - 10 reps Seated Shoulder Horizontal Abduction with Resistance - Thumbs Up - 2 x daily - 7 x weekly - 2 sets - 10 reps

## 2019-12-21 NOTE — Therapy (Signed)
[facility] [address] [place], [place], [address] Phone: [phone]   Fax:  [phone]  Physical Therapy Evaluation  Patient Details  Name: Lauren Castro MRN: [id number] Date of Birth: 02/09/1980 Referring Provider (PT): Farris Has, MD   Encounter Date: 12/21/2019   PT End of Session - 12/21/19 1615    Visit Number 1    Date for PT Re-Evaluation 02/15/20    Authorization Type BCBS State [employer]    PT Start Time 1537    PT Stop Time 1615    PT Time Calculation (min) 38 min    Activity Tolerance Patient tolerated treatment well    Behavior During Therapy [facility] for tasks assessed/performed           Past Medical History:  Diagnosis Date  . Anxiety   . Asthma     Past Surgical History:  Procedure Laterality Date  . BREAST EXCISIONAL BIOPSY Left   . BREAST SURGERY      There were no vitals filed for this visit.    Subjective Assessment - 12/21/19 1543    Subjective Pt reports to PT with Rt scapular pain that began in September 2021 after doing a HIIT class.  Pt reports that pain has gotten better in the past week and has started taking Barre.    Diagnostic tests none    Patient Stated Goals Reduce Rt scapular pain    Currently in Pain? Yes    Pain Score 4     Pain Location Scapula    Pain Orientation Right    Pain Descriptors / Indicators Sharp    Pain Type Chronic pain    Pain Onset More than a month ago    Pain Frequency Intermittent    Aggravating Factors  rotation of the Rt shoulder, lifting weights    Pain Relieving Factors Barre, stretches              [facility] PT Assessment - 12/21/19 0001      Assessment   Medical Diagnosis back pain, persistent back pain at scapula    Referring Provider (PT) Farris Has, MD    Onset Date/Surgical Date 09/21/19    Hand Dominance Right    Next MD Visit none     Prior Therapy none       Precautions   Precautions None      Restrictions    Weight Bearing Restrictions No      Balance Screen   Has the patient fallen in the past 6 months No    Has the patient had a decrease in activity level because of a fear of falling?  No    Is the patient reluctant to leave their home because of a fear of falling?  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, commissioning for [employer]    Leisure gym exercise 4x/wk, walking for exercise      Cognition   Overall Cognitive Status Within Functional Limits for tasks assessed      Observation/Other Assessments   Focus on Therapeutic Outcomes (FOTO)  25% limitation      Posture/Postural Control   Posture/Postural Control Postural limitations    Postural Limitations Decreased thoracic kyphosis      ROM / Strength   AROM /  PROM / Strength AROM;PROM;Strength      AROM   Overall AROM  Within functional limits for tasks performed    Overall AROM Comments full cervical A/ROM with Rt upper trap tension with Lt cervical sidebending.  Full thoracic rotation and flexion.        PROM   Overall PROM  Within functional limits for tasks performed      Strength   Overall Strength Within functional limits for tasks performed      Palpation   Spinal mobility reduced PA mobility T4-10 without pain.  Pain with PA mobs C4-6- normal mobility    Palpation comment trigger points in posterior Rt shoulder, medial scapular border and upper traps.        Transfers   Transfers Independent with all Transfers      Ambulation/Gait   Gait Pattern Within Functional Limits                      Objective measurements completed on examination: See above findings.               PT Education - 12/21/19 1627    Education Details Access Code: [email or web address]    Person(s) Educated Patient    Methods Explanation;Demonstration;Handout     Comprehension Verbalized understanding;Returned demonstration            PT Short Term Goals - 12/21/19 1619      PT SHORT TERM GOAL #1   Title be independent in initial HEP    Time 4    Period Weeks    Target Date 02/15/20      PT SHORT TERM GOAL #2   Title --      PT SHORT TERM GOAL #3   Title --             PT Long Term Goals - 12/21/19 1620      PT LONG TERM GOAL #1   Title be independent in advanced HEP    Time 8    Period Weeks    Status New    Target Date 02/15/20      PT LONG TERM GOAL #2   Title reduce FOTO to < or = to 15% limitation    Time 8    Period Weeks    Status New    Target Date 02/15/20      PT LONG TERM GOAL #3   Title report < or = to 1/10 Rt scapular pain with lifting weights and use of Rt UE    Time 8    Period Weeks    Status New    Target Date 02/15/20      PT LONG TERM GOAL #4   Title --      PT LONG TERM GOAL #5   Title --                  Plan - 12/21/19 1625    Clinical Impression Statement Pt presents to PT with Rt scapular pain that began September 2021 after taking a workout class at the gym.  Pt reports that pain has subsided quite a bit in the past month and she now experiences brief, sharp pain in the Rt scapular musculature with rotation of the shoulder and lifting weights in her weight training class.  Pt rates max pain as 4-5/10.  Pain is reduced with Barre class and light weight training of the upper body.  Pt with reduced thoracic kyphosis and otherwise  good postural alignment and awareness.  Pt with Rt upper trap tension with Lt cervical sidebending.  Pt with reduced PA mobility in the thoracic spine and tension and trigger points over Rt posterior shoulder, Rt medial scapular border and upper trap.  Pt will benefit from skilled PT to address scapular trigger points and improve postural strength and endurance.    Examination-Activity Limitations Lift    Stability/Clinical Decision Making Stable/Uncomplicated     Clinical Decision Making Low    Rehab Potential Excellent    PT Frequency 1x / week    PT Duration 8 weeks    PT Treatment/Interventions ADLs/Self Care Home Management;Cryotherapy;Electrical Stimulation;Moist Heat;Functional mobility training;Therapeutic activities;Therapeutic exercise;Neuromuscular re-education;Manual techniques;Patient/family education;Passive range of motion;Dry needling;Joint Manipulations;Spinal Manipulations;Taping    PT Next Visit Plan dry needling, review HEP, add to HEP if needed.    PT Home Exercise Plan Access Code: [email or web address]    Consulted and Agree with Plan of Care Patient           Patient will benefit from skilled therapeutic intervention in order to improve the following deficits and impairments:  Decreased activity tolerance, Postural dysfunction, Hypomobility, Pain  Visit Diagnosis: Cramp and spasm - Plan: PT plan of care cert/re-cert  Pain in thoracic spine - Plan: PT plan of care cert/re-cert     Problem List There are no problems to display for this patient.    Lorrene Reid, PT 12/21/19 4:32 PM  [facility] [address], [address] [place], [place], [address] Phone: [phone]   Fax:  [phone]  Name: Baillie Mohammad MRN: [id number] Date of Birth: 21-Jul-1980

## 2019-12-26 ENCOUNTER — Ambulatory Visit: Payer: BC Managed Care – PPO

## 2020-01-18 ENCOUNTER — Ambulatory Visit: Payer: BC Managed Care – PPO | Attending: Family Medicine

## 2020-01-18 ENCOUNTER — Other Ambulatory Visit: Payer: Self-pay

## 2020-01-18 DIAGNOSIS — M545 Low back pain, unspecified: Secondary | ICD-10-CM | POA: Insufficient documentation

## 2020-01-18 DIAGNOSIS — R252 Cramp and spasm: Secondary | ICD-10-CM | POA: Diagnosis not present

## 2020-01-18 DIAGNOSIS — M546 Pain in thoracic spine: Secondary | ICD-10-CM | POA: Insufficient documentation

## 2020-01-18 DIAGNOSIS — G8929 Other chronic pain: Secondary | ICD-10-CM | POA: Insufficient documentation

## 2020-01-18 DIAGNOSIS — R293 Abnormal posture: Secondary | ICD-10-CM | POA: Insufficient documentation

## 2020-01-18 NOTE — Therapy (Signed)
[facility] [address] [place], [place], [address] Phone: [phone]   Fax:  [phone]  Physical Therapy Treatment  Patient Details  Name: Lauren Castro MRN: [id number] Date of Birth: 1980-10-21 Referring Provider (PT): Farris Has, MD   Encounter Date: 01/18/2020   PT End of Session - 01/18/20 1608    Visit Number 2    Date for PT Re-Evaluation 02/15/20    Authorization Type [employer] State [employer]    PT Start Time 1535    PT Stop Time 1610    PT Time Calculation (min) 35 min    Activity Tolerance Patient tolerated treatment well    Behavior During Therapy [facility] for tasks assessed/performed           Past Medical History:  Diagnosis Date  . Anxiety   . Asthma     Past Surgical History:  Procedure Laterality Date  . BREAST EXCISIONAL BIOPSY Left   . BREAST SURGERY      There were no vitals filed for this visit.   Subjective Assessment - 01/18/20 1538    Subjective I haven't been doing my exercises as much as I should.    Currently in Pain? Yes    Pain Score 6     Pain Location Scapula    Pain Orientation Right    Pain Type Chronic pain    Pain Onset More than a month ago    Pain Frequency Intermittent    Aggravating Factors  end of th day, not stretching    Pain Relieving Factors Barre, stretching                             [facility] Adult PT Treatment/Exercise - 01/18/20 0001      Manual Therapy   Manual Therapy Soft tissue mobilization;Myofascial release    Manual therapy comments Rt upper traps, infraspinatus, subscap and rhomboids.            Trigger Point Dry Needling - 01/18/20 0001    Consent Given? Yes    Education Handout Provided Previously provided    Muscles Treated Head and Neck Upper trapezius    Muscles Treated Upper Quadrant Subscapularis;Infraspinatus;Rhomboids   Rt only   Upper Trapezius Response Twitch reponse elicited;Palpable increased muscle  length    Rhomboids Response Twitch response elicited;Palpable increased muscle length    Infraspinatus Response Twitch response elicited;Palpable increased muscle length    Subscapularis Response Twitch response elicited;Palpable increased muscle length                  PT Short Term Goals - 01/18/20 1539      PT SHORT TERM GOAL #1   Title be independent in initial HEP    Status Achieved             PT Long Term Goals - 12/21/19 1620      PT LONG TERM GOAL #1   Title be independent in advanced HEP    Time 8    Period Weeks    Status New    Target Date 02/15/20      PT LONG TERM GOAL #2   Title reduce FOTO to < or = to 15% limitation    Time 8    Period Weeks    Status New    Target Date 02/15/20      PT LONG TERM GOAL #3   Title report < or =  to 1/10 Rt scapular pain with lifting weights and use of Rt UE    Time 8    Period Weeks    Status New    Target Date 02/15/20      PT LONG TERM GOAL #4   Title --      PT LONG TERM GOAL #5   Title --                 Plan - 01/18/20 1614    Clinical Impression Statement Pt with first time follow-up after evaluation 1 month ago.  Pt had been performing her HEP and reports this is not as consistent as it should be.  Session focused on addressing multiple trigger points in Rt upper trap, posterior Rt shoulder and rhomboids.  Pt had a good response with twitch and tissue elongation in the musculature.  PT advised pt to stretch the area over the next couple of days for max benefit.  Pt will continue to benefit from skilled PT to address trigger points and tension in the Rt neck and shoulder.    PT Frequency 1x / week    PT Duration 8 weeks    PT Treatment/Interventions ADLs/Self Care Home Management;Cryotherapy;Electrical Stimulation;Moist Heat;Functional mobility training;Therapeutic activities;Therapeutic exercise;Neuromuscular re-education;Manual techniques;Patient/family education;Passive range of motion;Dry  needling;Joint Manipulations;Spinal Manipulations;Taping    PT Next Visit Plan dry needling, review HEP, add to HEP if needed.    PT Home Exercise Plan Access Code: [email or web address]    Recommended Other Services initial cert is signed    Consulted and Agree with Plan of Care Patient           Patient will benefit from skilled therapeutic intervention in order to improve the following deficits and impairments:  Decreased activity tolerance,Postural dysfunction,Hypomobility,Pain  Visit Diagnosis: Cramp and spasm  Pain in thoracic spine  Abnormal posture  Chronic right-sided low back pain without sciatica     Problem List There are no problems to display for this patient.    Lorrene Reid, PT 01/18/20 4:15 PM  [facility] [address] [place], [place], [address] Phone: [phone]   Fax:  [phone]  Name: Lauren Castro MRN: [id number] Date of Birth: 08/17/1980

## 2020-02-13 ENCOUNTER — Ambulatory Visit: Payer: BC Managed Care – PPO

## 2020-02-13 ENCOUNTER — Other Ambulatory Visit: Payer: Self-pay

## 2020-02-13 DIAGNOSIS — M546 Pain in thoracic spine: Secondary | ICD-10-CM

## 2020-02-13 DIAGNOSIS — M545 Low back pain, unspecified: Secondary | ICD-10-CM

## 2020-02-13 DIAGNOSIS — R252 Cramp and spasm: Secondary | ICD-10-CM

## 2020-02-13 DIAGNOSIS — R293 Abnormal posture: Secondary | ICD-10-CM

## 2020-02-13 DIAGNOSIS — G8929 Other chronic pain: Secondary | ICD-10-CM

## 2020-02-13 NOTE — Therapy (Signed)
[facility] [address], [address] [place], [place], [address] Phone: [phone]   Fax:  [phone]  Physical Therapy Treatment  Patient Details  Name: Lauren Castro MRN: [id number] Date of Birth: 09/04/1980 Referring Provider (PT): Farris Has, MD   Encounter Date: 02/13/2020   PT End of Session - 02/13/20 1659    Visit Number 3    Date for PT Re-Evaluation 03/26/20    Authorization Type [employer]    PT Start Time [phone]    PT Stop Time 1659    PT Time Calculation (min) 42 min    Activity Tolerance Patient tolerated treatment well    Behavior During Therapy [facility] for tasks assessed/performed           Past Medical History:  Diagnosis Date  . Anxiety   . Asthma     Past Surgical History:  Procedure Laterality Date  . BREAST EXCISIONAL BIOPSY Left   . BREAST SURGERY      There were no vitals filed for this visit.   Subjective Assessment - 02/13/20 1620    Subjective I am better.  My Rt shoulder blade still hurts.  I still have a 3 year history of LBP.  I do strength/stabilization exercises regularly.    Currently in Pain? Yes    Pain Score 5     Pain Location Scapula    Pain Orientation Right    Pain Descriptors / Indicators Sharp    Pain Type Chronic pain    Pain Onset More than a month ago    Pain Frequency Intermittent    Aggravating Factors  end of the day, not stretching    Pain Relieving Factors Barre, Stretching, resistance exercises with band    Multiple Pain Sites Yes    Pain Score 4    Pain Location Back    Pain Orientation Lower    Pain Descriptors / Indicators Sore    Pain Type Chronic pain    Pain Onset More than a month ago    Pain Frequency Intermittent    Aggravating Factors  not moving    Pain Relieving Factors movement              [facility] PT Assessment - 02/13/20 0001      Assessment   Medical Diagnosis back pain, persistent back pain at scapula    Referring  Provider (PT) Farris Has, MD    Onset Date/Surgical Date 09/21/19      Prior Function   Level of Independence Independent      Cognition   Overall Cognitive Status Within Functional Limits for tasks assessed      Observation/Other Assessments   Focus on Therapeutic Outcomes (FOTO)  15% limitation      Posture/Postural Control   Posture/Postural Control Postural limitations    Postural Limitations Decreased thoracic kyphosis      PROM   Overall PROM  Within functional limits for tasks performed      Strength   Overall Strength Within functional limits for tasks performed      Transfers   Transfers Independent with all Transfers      Ambulation/Gait   Gait Pattern Within Functional Limits                         [facility] Adult PT Treatment/Exercise - 02/13/20 0001      Exercises   Exercises Lumbar      Lumbar Exercises: Sidelying  Other Sidelying Lumbar Exercises open book x 10      Manual Therapy   Manual Therapy Soft tissue mobilization;Myofascial release    Manual therapy comments Rt upper traps, infraspinatus, subscap and rhomboids, Rt lumbar            Trigger Point Dry Needling - 02/13/20 0001    Consent Given? Yes    Education Handout Provided Previously provided    Muscles Treated Head and Neck Upper trapezius    Muscles Treated Upper Quadrant Subscapularis;Infraspinatus;Rhomboids   Rt only   Muscles Treated Back/Hip Lumbar multifidi    Upper Trapezius Response Twitch reponse elicited;Palpable increased muscle length    Rhomboids Response Twitch response elicited;Palpable increased muscle length    Infraspinatus Response Twitch response elicited;Palpable increased muscle length    Subscapularis Response Twitch response elicited;Palpable increased muscle length    Lumbar multifidi Response Twitch response elicited                PT Education - 02/13/20 1628    Education Details Access Code: [email or web address]    Person(s) Educated Patient     Methods Explanation;Demonstration;Handout    Comprehension Verbalized understanding;Returned demonstration            PT Short Term Goals - 01/18/20 1539      PT SHORT TERM GOAL #1   Title be independent in initial HEP    Status Achieved             PT Long Term Goals - 02/13/20 1628      PT LONG TERM GOAL #1   Title be independent in advanced HEP    Time 6    Period Weeks    Status On-going    Target Date 03/26/20      PT LONG TERM GOAL #2   Title reduce FOTO to < or = to 15% limitation    Status Achieved      PT LONG TERM GOAL #3   Title report < or = to 1/10 Rt scapular pain with lifting weights and use of Rt UE    Baseline 4-510    Time 6    Period Weeks    Status On-going    Target Date 03/26/20      PT LONG TERM GOAL #4   Title report a 50% reduction in LBP with daily tasks    Time 6    Period Weeks    Status New    Target Date 03/26/20                 Plan - 02/13/20 1704    Clinical Impression Statement Pt reports 50% overall improvement in Rt scapular pain since the start of care.  FOTO for Rt scapular is improved to 15% limitation. Pt rated pain as 4-5/10 today with multiple trigger points present today.  Pt with good response in lumbar and Rt thoracic spine with dry needling and demonstrated improved tissue mobility and reduced pain after manual therapy today.  Pt reports constant Rt lumbar pain that is chronic in nature.  PT added open book stretch to HEP and pt continues to do [place] and pilates. Pt will continue to benefit from skilled PT to address lumbar and thoracic pain and trigger points associated with each.    PT Frequency 1x / week    PT Duration 6 weeks    PT Treatment/Interventions ADLs/Self Care Home Management;Cryotherapy;Electrical Stimulation;Moist Heat;Functional mobility training;Therapeutic activities;Therapeutic exercise;Neuromuscular re-education;Manual techniques;Patient/family education;Passive range of motion;Dry  needling;Joint Manipulations;Spinal  Manipulations;Taping    PT Next Visit Plan dry needling, review HEP, add to HEP if needed.    PT Home Exercise Plan Access Code: [email or web address]    Recommended Other Services recert sent 02/13/20    Consulted and Agree with Plan of Care Patient           Patient will benefit from skilled therapeutic intervention in order to improve the following deficits and impairments:  Decreased activity tolerance,Postural dysfunction,Hypomobility,Pain  Visit Diagnosis: Cramp and spasm - Plan: PT plan of care cert/re-cert  Pain in thoracic spine - Plan: PT plan of care cert/re-cert  Abnormal posture - Plan: PT plan of care cert/re-cert  Chronic right-sided low back pain without sciatica - Plan: PT plan of care cert/re-cert     Problem List There are no problems to display for this patient.   Lorrene Reid, PT 02/13/20 5:08 PM  [facility] [address] [place], [place], [address] Phone: [phone]   Fax:  [phone]  Name: Lauren Castro MRN: [id number] Date of Birth: November 14, 1980

## 2020-02-13 NOTE — Patient Instructions (Signed)
Access Code: [email or web address] URL: [email or web address] Date: 02/13/2020 Prepared by: Tresa Endo  Exercises  Sidelying Open Book Thoracic Lumbar Rotation and Extension - 2 x daily - 7 x weekly - 1 sets - 10 reps

## 2020-03-12 ENCOUNTER — Ambulatory Visit: Payer: BC Managed Care – PPO | Attending: Family Medicine | Admitting: Physical Therapy

## 2020-03-12 ENCOUNTER — Other Ambulatory Visit: Payer: Self-pay

## 2020-03-12 ENCOUNTER — Encounter: Payer: Self-pay | Admitting: Physical Therapy

## 2020-03-12 DIAGNOSIS — R293 Abnormal posture: Secondary | ICD-10-CM | POA: Diagnosis present

## 2020-03-12 DIAGNOSIS — M545 Low back pain, unspecified: Secondary | ICD-10-CM | POA: Diagnosis present

## 2020-03-12 DIAGNOSIS — R252 Cramp and spasm: Secondary | ICD-10-CM | POA: Diagnosis not present

## 2020-03-12 DIAGNOSIS — G8929 Other chronic pain: Secondary | ICD-10-CM

## 2020-03-12 DIAGNOSIS — M546 Pain in thoracic spine: Secondary | ICD-10-CM

## 2020-03-12 NOTE — Therapy (Signed)
[facility] [address] [place], [place], [address] Phone: [phone]   Fax:  [phone]  Physical Therapy Treatment  Patient Details  Name: Lauren Castro MRN: [id number] Date of Birth: [birth year] Referring Provider (PT): Farris Has, MD   Encounter Date: 03/12/2020   PT End of Session - 03/12/20 1542    Visit Number 4    Date for PT Re-Evaluation 03/26/20    Authorization Type [employer]    PT Start Time 1540    PT Stop Time 1620    PT Time Calculation (min) 40 min    Activity Tolerance Patient tolerated treatment well    Behavior During Therapy [facility] for tasks assessed/performed           Past Medical History:  Diagnosis Date  . Anxiety   . Asthma     Past Surgical History:  Procedure Laterality Date  . BREAST EXCISIONAL BIOPSY Left   . BREAST SURGERY      There were no vitals filed for this visit.   Subjective Assessment - 03/12/20 1546    Subjective Going good, some weird sensations neck to Rt scapula when I look down.    Currently in Pain? Yes    Pain Score 3     Pain Location Scapula    Pain Orientation Right    Pain Descriptors / Indicators Dull    Aggravating Factors  end of day    Pain Relieving Factors exercising    Multiple Pain Sites No                             [facility] Adult PT Treatment/Exercise - 03/12/20 0001      Lumbar Exercises: Supine   Other Supine Lumbar Exercises Soft foam roll decompression and cervical hydration series                    PT Short Term Goals - 01/18/20 1539      PT SHORT TERM GOAL #1   Title be independent in initial HEP    Status Achieved             PT Long Term Goals - 03/12/20 1611      PT LONG TERM GOAL #1   Title be independent in advanced HEP    Time 6    Period Weeks    Status On-going                 Plan - 03/12/20 1542    Clinical Impression Statement Pt doing well overall,  has intermittent "weird sensations" into my Rt shoulder and axilla when I look down. Pt participated in full fascial release and hydration series using the soft foam roll.    Examination-Activity Limitations Lift    Stability/Clinical Decision Making Stable/Uncomplicated    Rehab Potential Excellent    PT Frequency 1x / week    PT Duration 6 weeks    PT Treatment/Interventions ADLs/Self Care Home Management;Cryotherapy;Electrical Stimulation;Moist Heat;Functional mobility training;Therapeutic activities;Therapeutic exercise;Neuromuscular re-education;Manual techniques;Patient/family education;Passive range of motion;Dry needling;Joint Manipulations;Spinal Manipulations;Taping    PT Next Visit Plan dry needling, see how pt did with foam roll routine, pt may benefit from home use.    PT Home Exercise Plan Access Code: [email or web address]    Consulted and Agree with Plan of Care Patient           Patient will benefit from skilled therapeutic intervention in  order to improve the following deficits and impairments:  Decreased activity tolerance,Postural dysfunction,Hypomobility,Pain  Visit Diagnosis: Cramp and spasm  Pain in thoracic spine  Abnormal posture  Chronic right-sided low back pain without sciatica     Problem List There are no problems to display for this patient.   Hodan Wurtz, PTA 03/12/2020, 4:23 PM  [facility] [address], [address] [place], [place], [address] Phone: [phone]   Fax:  [phone]  Name: Lauren Castro MRN: [id number] Date of Birth: 12-Oct-1980

## 2020-03-21 ENCOUNTER — Ambulatory Visit: Payer: BC Managed Care – PPO | Attending: Family Medicine

## 2020-03-21 ENCOUNTER — Other Ambulatory Visit: Payer: Self-pay

## 2020-03-21 DIAGNOSIS — R293 Abnormal posture: Secondary | ICD-10-CM | POA: Insufficient documentation

## 2020-03-21 DIAGNOSIS — G8929 Other chronic pain: Secondary | ICD-10-CM | POA: Diagnosis present

## 2020-03-21 DIAGNOSIS — M545 Low back pain, unspecified: Secondary | ICD-10-CM | POA: Insufficient documentation

## 2020-03-21 DIAGNOSIS — R252 Cramp and spasm: Secondary | ICD-10-CM | POA: Insufficient documentation

## 2020-03-21 DIAGNOSIS — M546 Pain in thoracic spine: Secondary | ICD-10-CM | POA: Diagnosis not present

## 2020-03-21 NOTE — Patient Instructions (Signed)
Access Code: [email or web address] URL: [email or web address] Date: 03/21/2020 Prepared by: Tresa Endo  Exercises  Beginner Clam - 2 x daily - 7 x weekly - 2 sets - 10 reps

## 2020-03-21 NOTE — Therapy (Signed)
[facility] [address], [address] [place], [place], [address] Phone: [phone]   Fax:  [phone]  Physical Therapy Treatment  Patient Details  Name: Lauren Castro MRN: [id number] Date of Birth: Oct 04, 1980 Referring Provider (PT): Farris Has, MD   Encounter Date: 03/21/2020   PT End of Session - 03/21/20 1701    Visit Number 5    Date for PT Re-Evaluation 03/26/20    Authorization Type [employer]    PT Start Time 1616    PT Stop Time 1657    PT Time Calculation (min) 41 min    Activity Tolerance Patient tolerated treatment well    Behavior During Therapy [facility] for tasks assessed/performed           Past Medical History:  Diagnosis Date  . Anxiety   . Asthma     Past Surgical History:  Procedure Laterality Date  . BREAST EXCISIONAL BIOPSY Left   . BREAST SURGERY      There were no vitals filed for this visit.   Subjective Assessment - 03/21/20 1620    Subjective My shoulder is still hurting.  70% better overall.  I've done some foam rolling and tried to lessen the force on my Rt shoulder blade    Currently in Pain? Yes    Pain Score 3     Pain Location Scapula    Pain Orientation Right    Pain Descriptors / Indicators Dull    Pain Type Chronic pain    Pain Onset More than a month ago    Pain Frequency Intermittent    Aggravating Factors  not moving as much    Pain Relieving Factors exercising                             [facility] Adult PT Treatment/Exercise - 03/21/20 0001      Lumbar Exercises: Sidelying   Clam Both;20 reps      Manual Therapy   Manual Therapy Soft tissue mobilization;Myofascial release    Manual therapy comments Rt upper traps, infraspinatus, subscap and rhomboids            Trigger Point Dry Needling - 03/21/20 0001    Consent Given? Yes    Muscles Treated Head and Neck Upper trapezius    Muscles Treated Upper Quadrant  Subscapularis;Infraspinatus;Rhomboids   Rt only   Muscles Treated Back/Hip Lumbar multifidi    Upper Trapezius Response Twitch reponse elicited;Palpable increased muscle length    Rhomboids Response Twitch response elicited;Palpable increased muscle length    Infraspinatus Response Twitch response elicited;Palpable increased muscle length    Subscapularis Response Twitch response elicited;Palpable increased muscle length    Lumbar multifidi Response Twitch response elicited                PT Education - 03/21/20 1632    Education Details Access Code: [email or web address], Barre exercises that include core/gluteals    Person(s) Educated Patient    Methods Explanation;Demonstration;Handout    Comprehension Verbalized understanding;Returned demonstration            PT Short Term Goals - 01/18/20 1539      PT SHORT TERM GOAL #1   Title be independent in initial HEP    Status Achieved             PT Long Term Goals - 03/21/20 1624      PT LONG TERM GOAL #1  Title be independent in advanced HEP    Status On-going      PT LONG TERM GOAL #3   Title report < or = to 1/10 Rt scapular pain with lifting weights and use of Rt UE    Baseline 4/10 max      PT LONG TERM GOAL #4   Title report a 50% reduction in LBP with daily tasks    Baseline 3-4/10    Time 6    Period Weeks    Status On-going                 Plan - 03/21/20 1700    Clinical Impression Statement Pt reports 70% overall improvement in Rt scapular symptoms since the start of care. No change in lumbar pain.  PT added clams to HEP today and advised pt to work on core through barre class.  Pt with significant trigger points and tension in the Rt scapular region and multiple twitch responses with needling.  Pt with improved mobility and reduced tension after manual therapy today.  Pt will attend next week to finalize HEP and continue manual therapy as needed.    PT Frequency 1x / week    PT Duration 6 weeks    PT  Treatment/Interventions ADLs/Self Care Home Management;Cryotherapy;Electrical Stimulation;Moist Heat;Functional mobility training;Therapeutic activities;Therapeutic exercise;Neuromuscular re-education;Manual techniques;Patient/family education;Passive range of motion;Dry needling;Joint Manipulations;Spinal Manipulations;Taping    PT Next Visit Plan finalize HEP and dry needling.  D/C to HEP    PT Home Exercise Plan Access Code: [email or web address]    Consulted and Agree with Plan of Care Patient           Patient will benefit from skilled therapeutic intervention in order to improve the following deficits and impairments:  Decreased activity tolerance,Postural dysfunction,Hypomobility,Pain  Visit Diagnosis: Pain in thoracic spine  Cramp and spasm  Abnormal posture  Chronic right-sided low back pain without sciatica     Problem List There are no problems to display for this patient.    Lorrene Reid, PT 03/21/20 5:02 PM   [facility] [address] [place], [place], [address] Phone: [phone]   Fax:  [phone]  Name: Lauren Castro MRN: [id number] Date of Birth: 03-May-1980

## 2020-03-26 ENCOUNTER — Other Ambulatory Visit: Payer: Self-pay

## 2020-03-26 ENCOUNTER — Ambulatory Visit: Payer: BC Managed Care – PPO

## 2020-03-26 DIAGNOSIS — M546 Pain in thoracic spine: Secondary | ICD-10-CM

## 2020-03-26 DIAGNOSIS — G8929 Other chronic pain: Secondary | ICD-10-CM

## 2020-03-26 DIAGNOSIS — M545 Low back pain, unspecified: Secondary | ICD-10-CM

## 2020-03-26 DIAGNOSIS — R293 Abnormal posture: Secondary | ICD-10-CM

## 2020-03-26 DIAGNOSIS — R252 Cramp and spasm: Secondary | ICD-10-CM

## 2020-03-26 NOTE — Therapy (Signed)
[facility] [address] [place], [place], [address] Phone: [phone]   Fax:  [phone]  Physical Therapy Treatment  Patient Details  Name: Lauren Castro MRN: [id number] Date of Birth: 08-04-1980 Referring Provider (PT): [place] Pepper, MD   Encounter Date: 03/26/2020   PT End of Session - 03/26/20 1649    Visit Number 6    PT Start Time 9373    PT Stop Time 1650    PT Time Calculation (min) 35 min    Activity Tolerance Patient tolerated treatment well    Behavior During Therapy [facility] for tasks assessed/performed           Past Medical History:  Diagnosis Date  . Anxiety   . Asthma     Past Surgical History:  Procedure Laterality Date  . BREAST EXCISIONAL BIOPSY Left   . BREAST SURGERY      There were no vitals filed for this visit.   Subjective Assessment - 03/26/20 1613    Subjective I sitll have that spot that catches on my Rt shoulder blade.    Currently in Pain? Yes    Pain Score 3     Pain Location Scapula    Pain Orientation Right    Pain Descriptors / Indicators Dull    Pain Type Chronic pain    Pain Onset More than a month ago    Pain Frequency Intermittent    Aggravating Factors  unknown, not moving    Pain Relieving Factors movement, dry needling              [facility] PT Assessment - 03/26/20 0001      Assessment   Medical Diagnosis back pain, persistent back pain at scapula    Referring Provider (PT) London Pepper, MD    Onset Date/Surgical Date 09/21/19      Prior Function   Level of Independence Independent      Cognition   Overall Cognitive Status Within Functional Limits for tasks assessed      Observation/Other Assessments   Focus on Therapeutic Outcomes (FOTO)  17% limitation                         [facility] Adult PT Treatment/Exercise - 03/26/20 0001      Manual Therapy   Manual Therapy Soft tissue mobilization;Myofascial release    Manual  therapy comments Rt upper traps, infraspinatus, subscap and rhomboids            Trigger Point Dry Needling - 03/26/20 0001    Consent Given? Yes    Education Handout Provided Previously provided    Muscles Treated Head and Neck Upper trapezius    Muscles Treated Upper Quadrant Subscapularis;Infraspinatus;Rhomboids    Muscles Treated Back/Hip Lumbar multifidi    Upper Trapezius Response Twitch reponse elicited;Palpable increased muscle length    Rhomboids Response Twitch response elicited;Palpable increased muscle length    Infraspinatus Response Twitch response elicited;Palpable increased muscle length    Subscapularis Response Twitch response elicited;Palpable increased muscle length    Lumbar multifidi Response Twitch response elicited                  PT Short Term Goals - 01/18/20 1539      PT SHORT TERM GOAL #1   Title be independent in initial HEP    Status Achieved             PT Long Term Goals - 03/26/20  [place] #1   Title be independent in advanced HEP    Status Achieved      PT LONG TERM GOAL #3   Title report < or = to 1/10 Rt scapular pain with lifting weights and use of Rt UE    Baseline 4/10 max    Status Partially Met      PT LONG TERM GOAL #4   Title report a 50% reduction in LBP with daily tasks    Baseline no significant change    Status On-going                 Plan - 03/26/20 1652    Clinical Impression Statement Pt is ready for D/C to HEP.  Pt reports 70% overall improvement in Rt scapular symptoms since the start of care. No change in lumbar pain.  Pt will D/C today to HEP for strength and flexibility.  Pt with significant trigger points and tension in the Rt scapular region and multiple twitch responses with needling.  Pt with improved mobility and reduced tension after manual therapy today.    PT Next Visit Plan D/C PT to HEP    PT Home Exercise Plan Access Code: [email or web address]    Consulted and Agree with Plan  of Care Patient           Patient will benefit from skilled therapeutic intervention in order to improve the following deficits and impairments:     Visit Diagnosis: Pain in thoracic spine  Cramp and spasm  Abnormal posture  Chronic right-sided low back pain without sciatica     Problem List There are no problems to display for this patient.  PHYSICAL THERAPY DISCHARGE SUMMARY  Visits from Start of Care: 6  Current functional level related to goals / functional outcomes: See above for current status.     Remaining deficits: Rt scapular pain and trigger points.  Pt has HEP in place to address remaining deficits.     Education / Equipment: HEP, posture/mechanics  Plan: Patient agrees to discharge.  Patient goals were partially met. Patient is being discharged due to being pleased with the current functional level.  ?????        Sigurd Sos, PT 03/26/20 4:53 PM  [facility] [address] [place], [place], [address] Phone: [phone]   Fax:  [phone]  Name: Lauren Castro MRN: [id number] Date of Birth: April 05, 1980

## 2020-09-10 ENCOUNTER — Other Ambulatory Visit: Payer: Self-pay | Admitting: Family Medicine

## 2020-09-10 DIAGNOSIS — Z1231 Encounter for screening mammogram for malignant neoplasm of breast: Secondary | ICD-10-CM

## 2020-10-19 ENCOUNTER — Ambulatory Visit
Admission: RE | Admit: 2020-10-19 | Discharge: 2020-10-19 | Disposition: A | Discharge status: Home / Self Care | Payer: BC Managed Care – PPO | Source: Ambulatory Visit | Attending: Family Medicine | Admitting: Family Medicine

## 2020-10-19 ENCOUNTER — Other Ambulatory Visit: Payer: Self-pay

## 2020-10-19 DIAGNOSIS — Z1231 Encounter for screening mammogram for malignant neoplasm of breast: Secondary | ICD-10-CM

## 2020-10-23 ENCOUNTER — Other Ambulatory Visit: Payer: Self-pay | Admitting: Family Medicine

## 2020-10-23 DIAGNOSIS — R102 Pelvic and perineal pain: Secondary | ICD-10-CM

## 2020-11-13 ENCOUNTER — Other Ambulatory Visit: Payer: Self-pay

## 2020-11-13 ENCOUNTER — Other Ambulatory Visit: Payer: Self-pay | Admitting: Sports Medicine

## 2020-11-13 ENCOUNTER — Ambulatory Visit
Admission: RE | Admit: 2020-11-13 | Discharge: 2020-11-13 | Disposition: A | Discharge status: Home / Self Care | Payer: BC Managed Care – PPO | Source: Ambulatory Visit | Attending: Sports Medicine | Admitting: Sports Medicine

## 2020-11-13 DIAGNOSIS — M545 Low back pain, unspecified: Secondary | ICD-10-CM

## 2020-11-21 ENCOUNTER — Ambulatory Visit
Admission: RE | Admit: 2020-11-21 | Discharge: 2020-11-21 | Disposition: A | Discharge status: Home / Self Care | Payer: BC Managed Care – PPO | Source: Ambulatory Visit | Attending: Family Medicine | Admitting: Family Medicine

## 2020-11-21 DIAGNOSIS — R102 Pelvic and perineal pain: Secondary | ICD-10-CM

## 2021-06-12 ENCOUNTER — Telehealth: Payer: Self-pay

## 2021-06-12 NOTE — Telephone Encounter (Signed)
NOTES SCANNED TO REFERRAL 

## 2021-09-24 ENCOUNTER — Other Ambulatory Visit: Payer: Self-pay | Admitting: Family Medicine

## 2021-09-24 DIAGNOSIS — Z1231 Encounter for screening mammogram for malignant neoplasm of breast: Secondary | ICD-10-CM

## 2021-10-21 ENCOUNTER — Ambulatory Visit
Admission: RE | Admit: 2021-10-21 | Discharge: 2021-10-21 | Disposition: A | Discharge status: Home / Self Care | Payer: BC Managed Care – PPO | Source: Ambulatory Visit | Attending: Family Medicine | Admitting: Family Medicine

## 2021-10-21 DIAGNOSIS — Z1231 Encounter for screening mammogram for malignant neoplasm of breast: Secondary | ICD-10-CM

## 2021-10-23 ENCOUNTER — Other Ambulatory Visit: Payer: Self-pay | Admitting: Family Medicine

## 2021-10-23 DIAGNOSIS — R928 Other abnormal and inconclusive findings on diagnostic imaging of breast: Secondary | ICD-10-CM

## 2021-12-02 ENCOUNTER — Ambulatory Visit: Payer: BC Managed Care – PPO

## 2021-12-02 ENCOUNTER — Ambulatory Visit
Admission: RE | Admit: 2021-12-02 | Discharge: 2021-12-02 | Disposition: A | Discharge status: Home / Self Care | Payer: BC Managed Care – PPO | Source: Ambulatory Visit | Attending: Family Medicine | Admitting: Family Medicine

## 2021-12-02 DIAGNOSIS — R928 Other abnormal and inconclusive findings on diagnostic imaging of breast: Secondary | ICD-10-CM

## 2021-12-12 IMAGING — MG MM DIGITAL SCREENING BILAT W/ TOMO AND CAD
6 of 10 series · 6 of 30 positions shown · non-contrast
Comparison: Previous exam(s).

CLINICAL DATA: Screening.

EXAM:
DIGITAL SCREENING BILATERAL MAMMOGRAM WITH TOMOSYNTHESIS AND CAD
TECHNIQUE: Bilateral screening digital craniocaudal and mediolateral oblique
mammograms were obtained. Bilateral screening digital breast
tomosynthesis was performed. The images were evaluated with
computer-aided detection.

[L CC synth-2D]
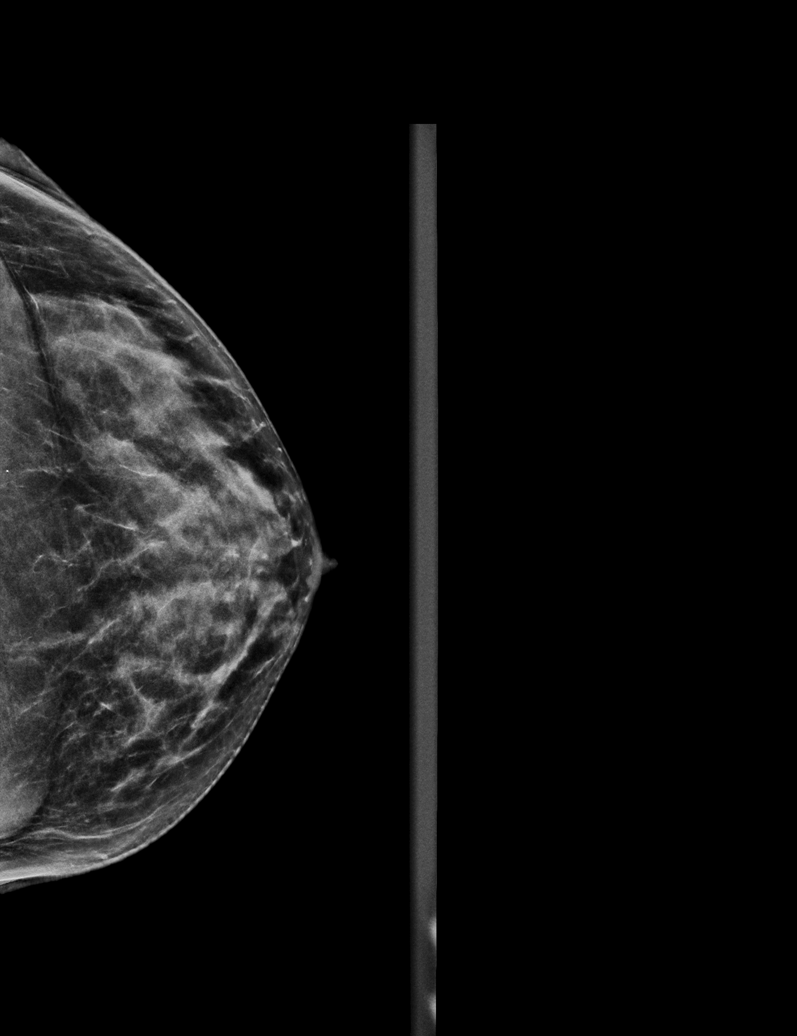

[R CC synth-2D (1 of 2)]
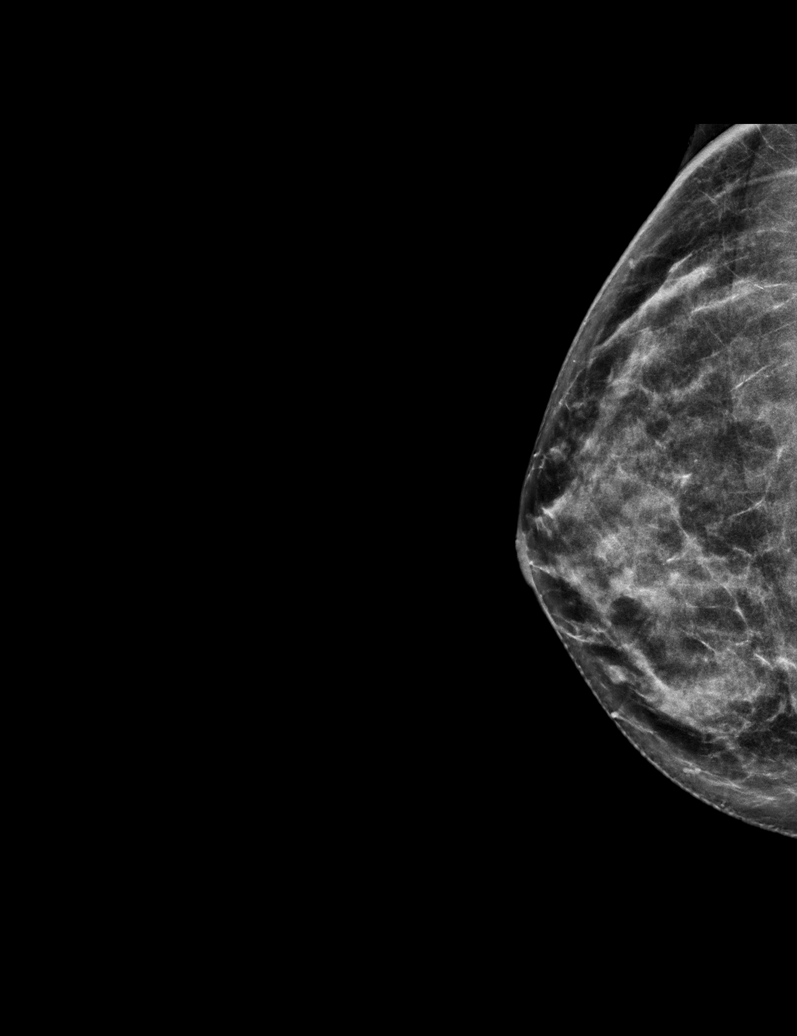

[L MLO synth-2D]
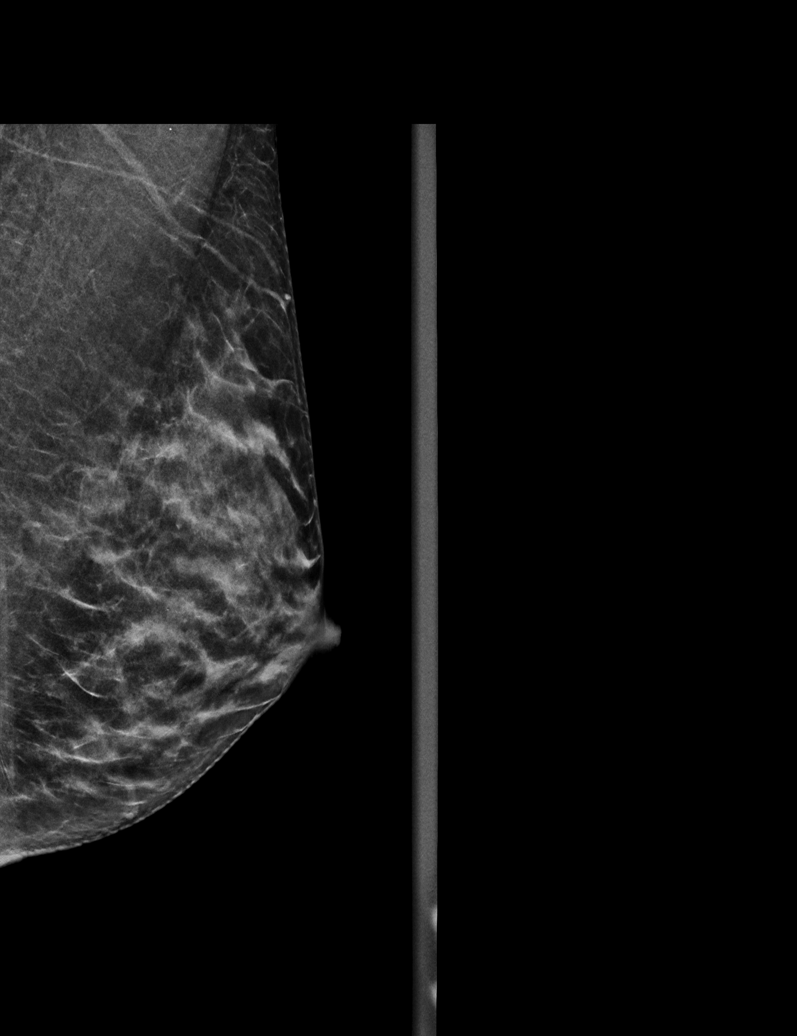

[R CC synth-2D (2 of 2)]
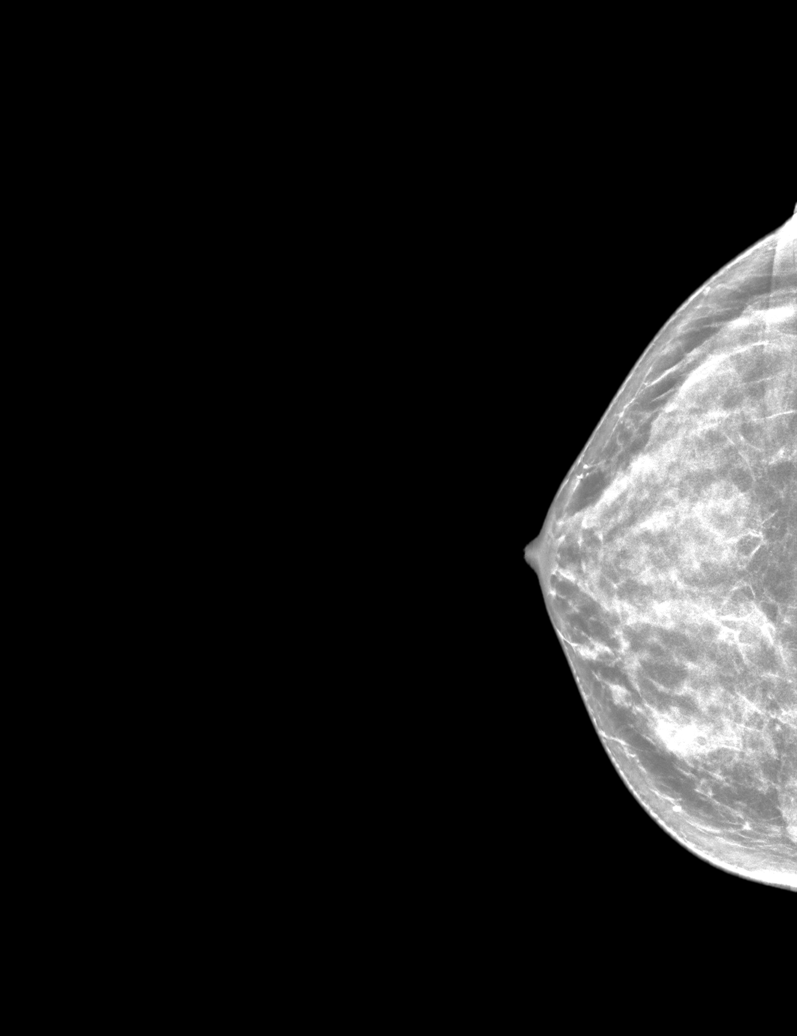

[R MLO synth-2D]
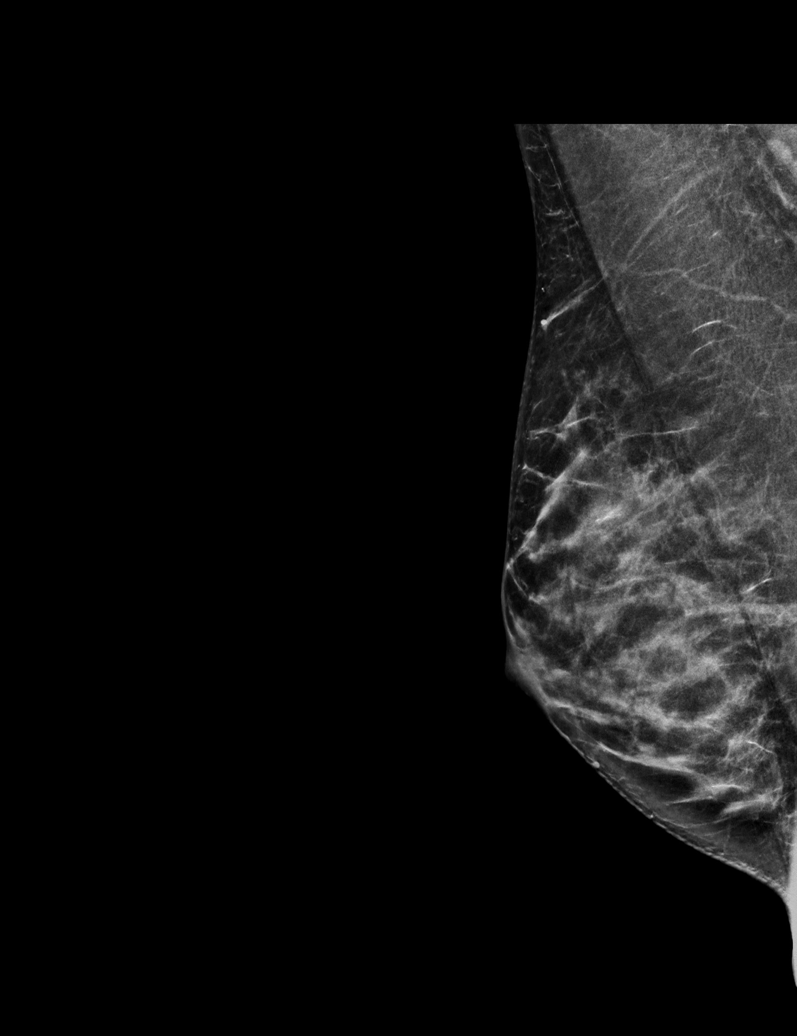

[R MLO tomo · tomo slice 27/54.0]
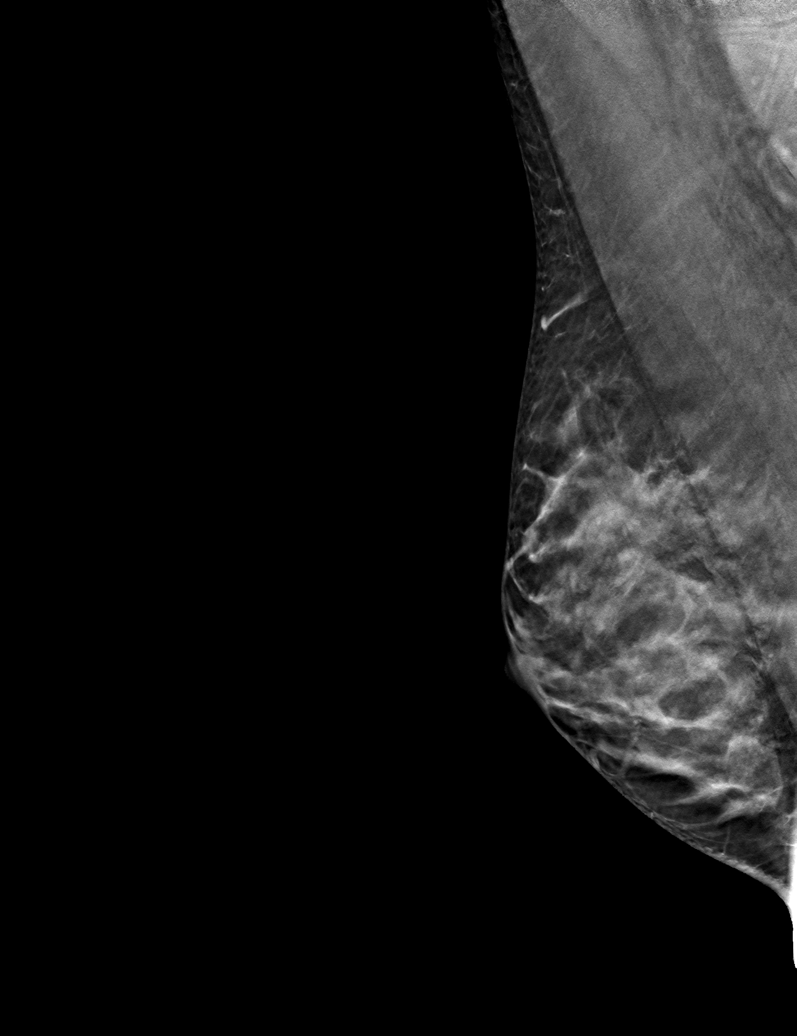

[6 of 30 positions shown; findings below may reference images not displayed]

ACR Breast Density Category c: The breast tissue is heterogeneously
dense, which may obscure small masses.
FINDINGS: There are no findings suspicious for malignancy.
IMPRESSION: No mammographic evidence of malignancy. A result letter of this
screening mammogram will be mailed directly to the patient.

RECOMMENDATION:
Screening mammogram in one year. (Code:Q3-W-BC3)

BI-RADS CATEGORY  1: Negative.

## 2022-01-13 DIAGNOSIS — N939 Abnormal uterine and vaginal bleeding, unspecified: Secondary | ICD-10-CM

## 2022-01-13 DIAGNOSIS — D649 Anemia, unspecified: Secondary | ICD-10-CM

## 2022-01-13 HISTORY — DX: Anemia, unspecified: D64.9

## 2022-01-13 HISTORY — DX: Abnormal uterine and vaginal bleeding, unspecified: N93.9

## 2022-09-17 ENCOUNTER — Other Ambulatory Visit: Payer: Self-pay | Admitting: Family Medicine

## 2022-09-17 DIAGNOSIS — Z1231 Encounter for screening mammogram for malignant neoplasm of breast: Secondary | ICD-10-CM

## 2022-10-31 ENCOUNTER — Encounter: Payer: Self-pay | Admitting: Plastic Surgery

## 2022-10-31 ENCOUNTER — Ambulatory Visit (INDEPENDENT_AMBULATORY_CARE_PROVIDER_SITE_OTHER): Payer: Self-pay | Admitting: Plastic Surgery

## 2022-10-31 VITALS — BP 133/77 | HR 72 | Ht 63.0 in | Wt 121.0 lb

## 2022-10-31 DIAGNOSIS — Z719 Counseling, unspecified: Secondary | ICD-10-CM

## 2022-10-31 NOTE — Progress Notes (Signed)
The patient is a very pleasant lady here for evaluation of her face.  She has undergone multiple facial treatments in the past.  She had filler and botox recently.  She has done an amazing job in caring for her face.  She is interested in a face lift.  I shared with her some ideas about the Halo, Moxi and BBL.  I also think she would do very well with ZO and have referred her to [place].  I think that now is too soon for a face lift.  I think that a face lift now would last 5 to 8 years.  She is a great candidate for the Halo.  She is going to thinks things over and see Lily.  I have sent her the sciton information.

## 2022-11-03 ENCOUNTER — Ambulatory Visit (INDEPENDENT_AMBULATORY_CARE_PROVIDER_SITE_OTHER): Payer: Self-pay

## 2022-11-03 DIAGNOSIS — Z719 Counseling, unspecified: Secondary | ICD-10-CM

## 2022-11-04 ENCOUNTER — Emergency Department (HOSPITAL_COMMUNITY)
Admission: EM | Admit: 2022-11-04 | Discharge: 2022-11-05 | Disposition: A | Discharge status: Home / Self Care | Payer: BC Managed Care – PPO | Attending: Emergency Medicine | Admitting: Emergency Medicine

## 2022-11-04 ENCOUNTER — Other Ambulatory Visit: Payer: Self-pay

## 2022-11-04 ENCOUNTER — Encounter (HOSPITAL_COMMUNITY): Payer: Self-pay | Admitting: Emergency Medicine

## 2022-11-04 DIAGNOSIS — N938 Other specified abnormal uterine and vaginal bleeding: Secondary | ICD-10-CM | POA: Diagnosis not present

## 2022-11-04 DIAGNOSIS — D500 Iron deficiency anemia secondary to blood loss (chronic): Secondary | ICD-10-CM | POA: Insufficient documentation

## 2022-11-04 DIAGNOSIS — N939 Abnormal uterine and vaginal bleeding, unspecified: Secondary | ICD-10-CM | POA: Diagnosis present

## 2022-11-04 LAB — CBC WITH DIFFERENTIAL/PLATELET
Abs Immature Granulocytes: 0.03 10*3/uL (ref 0.00–0.07)
Basophils Absolute: 0 10*3/uL (ref 0.0–0.1)
Basophils Relative: 0 %
Eosinophils Absolute: 0.2 10*3/uL (ref 0.0–0.5)
Eosinophils Relative: 1 %
HCT: 33.3 % — ABNORMAL LOW (ref 36.0–46.0)
Hemoglobin: 11.1 g/dL — ABNORMAL LOW (ref 12.0–15.0)
Immature Granulocytes: 0 %
Lymphocytes Relative: 24 %
Lymphs Abs: 2.6 10*3/uL (ref 0.7–4.0)
MCH: 32 pg (ref 26.0–34.0)
MCHC: 33.3 g/dL (ref 30.0–36.0)
MCV: 96 fL (ref 80.0–100.0)
Monocytes Absolute: 0.6 10*3/uL (ref 0.1–1.0)
Monocytes Relative: 5 %
Neutro Abs: 7.2 10*3/uL (ref 1.7–7.7)
Neutrophils Relative %: 70 %
Platelets: 253 10*3/uL (ref 150–400)
RBC: 3.47 MIL/uL — ABNORMAL LOW (ref 3.87–5.11)
RDW: 13.2 % (ref 11.5–15.5)
WBC: 10.5 10*3/uL (ref 4.0–10.5)
nRBC: 0 % (ref 0.0–0.2)

## 2022-11-04 LAB — HEMOGLOBIN AND HEMATOCRIT, BLOOD
HCT: 30.5 % — ABNORMAL LOW (ref 36.0–46.0)
Hemoglobin: 10.3 g/dL — ABNORMAL LOW (ref 12.0–15.0)

## 2022-11-04 LAB — CBG MONITORING, ED: Glucose-Capillary: 105 mg/dL — ABNORMAL HIGH (ref 70–99)

## 2022-11-04 NOTE — ED Notes (Signed)
Pt requested to lie down on stretcher, upon lying down, pt began to feel nauseous and sat backup. Dr. Wilkie Aye at bedside to assess. Pt skin color pale, skin is diaphoretic, HR 45 bpm on cardiac monitor and BP 67/39. 2 liter NS fluid bolus administered, 2329- HR 73 BP 127/80.

## 2022-11-04 NOTE — ED Triage Notes (Signed)
  Patient comes in with vaginal bleeding from heavy menstrual cycle.  Patient states she started on 10/18 and it got heavier on 10/20 with her having to use large tampon every 15 minutes.  Was seen by PCP yesterday and given prescription for megastrol acetate tablets.  Patient states she took them yesterday and today.  Patient felt like it was helping but passed large clot and had heavy bleeding around 2100.  Patient states she has felt more tired and wanted to be seen to make sure everything was ok.  No pain at this time.  Denies any SOB.

## 2022-11-04 NOTE — ED Provider Notes (Signed)
[place] EMERGENCY DEPARTMENT AT [facility] Provider Note   CSN: [id number] Arrival date & time: 11/04/22  2129     History {Add pertinent medical, surgical, social history, OB history to HPI:1} Chief Complaint  Patient presents with   Vaginal Bleeding    Aleathea Villari is a 42 y.o. female.  HPI     This is a 42 year old female who presents with concern for menorrhagia.  Patient reports heavy vaginal bleeding.  Onset of menstrual period on 10/18.  Since that time she had frequent tampon changes and passage of clot.  She was seen in urgent care yesterday and subsequently contacted her OB/GYN.  She was started on Megace.  She feels like the bleeding subsided; however tonight she had recurrence of heavy bleeding around 7:30 PM.  Upon my arrival to the room, patient is diaphoretic and pale.  Blood pressure dropped precipitously.  She states she feels very dizzy and nauseated.  This is the first time she has felt nausea.  Denies any significant abdominal pain.  No urinary symptoms.  Chart reviewed and urine pregnancy test was negative yesterday.  Home Medications Prior to Admission medications   Medication Sig Start Date End Date Taking? Authorizing Provider  Ascorbic Acid (VITAMIN C) 100 MG tablet Take 100 mg by mouth daily.    [provider]  escitalopram (LEXAPRO) 10 MG tablet Take 10 mg by mouth daily. 08/14/22   [provider]  ibuprofen (ADVIL,MOTRIN) 200 MG tablet Take 400 mg by mouth every 8 (eight) hours as needed.    [provider]  norethindrone-ethinyl estradiol-FE (LOESTRIN FE) 1-20 MG-MCG tablet Take 1 tablet by mouth daily. 07/12/22   [provider]  predniSONE (DELTASONE) 20 MG tablet Take 3 tablets (60 mg total) by mouth daily. Patient not taking: Reported on 08/03/2018 04/22/16   Anson Fret, MD  vitamin B-12 (CYANOCOBALAMIN) 500 MCG tablet Take 500 mcg by mouth daily.    [provider]      Allergies     Patient has no known allergies.    Review of Systems   Review of Systems  Constitutional:  Negative for fever.  Respiratory:  Negative for shortness of breath.   Cardiovascular:  Negative for chest pain.  Gastrointestinal:  Negative for abdominal pain.  Genitourinary:  Positive for vaginal bleeding. Negative for dysuria.  All other systems reviewed and are negative.   Physical Exam Updated Vital Signs BP 123/75   Pulse 73   Temp 98.2 F (36.8 C) (Oral)   Resp 15   Ht 1.6 m (5\' 3" )   Wt 54.9 kg   LMP 10/31/2022 (Approximate)   SpO2 100%   BMI 21.43 kg/m  Physical Exam Vitals and nursing note reviewed.  Constitutional:      Appearance: She is well-developed.     Comments: Pale, diaphoretic  HENT:     Head: Normocephalic and atraumatic.  Eyes:     Pupils: Pupils are equal, round, and reactive to light.  Cardiovascular:     Rate and Rhythm: Normal rate and regular rhythm.     Heart sounds: Normal heart sounds.  Pulmonary:     Effort: Pulmonary effort is normal. No respiratory distress.     Breath sounds: No wheezing.  Abdominal:     Palpations: Abdomen is soft.     Tenderness: There is no abdominal tenderness.  Musculoskeletal:     Cervical back: Neck supple.  Skin:    General: Skin is warm and dry.  Neurological:     Mental Status: She is alert and oriented to person, place, and time.  Psychiatric:        Mood and Affect: Mood normal.     ED Results / Procedures / Treatments   Labs (all labs ordered are listed, but only abnormal results are displayed) Labs Reviewed  CBC WITH DIFFERENTIAL/PLATELET - Abnormal; Notable for the following components:      Result Value   RBC 3.47 (*)    Hemoglobin 11.1 (*)    HCT 33.3 (*)    All other components within normal limits  HEMOGLOBIN AND HEMATOCRIT, BLOOD - Abnormal; Notable for the following components:   Hemoglobin 10.3 (*)    HCT 30.5 (*)    All other components within normal limits  CBG MONITORING, ED -  Abnormal; Notable for the following components:   Glucose-Capillary 105 (*)    All other components within normal limits  HCG, QUANTITATIVE, PREGNANCY  BASIC METABOLIC PANEL  TYPE AND SCREEN    EKG None  Radiology No results found.  Procedures Procedures  {Document cardiac monitor, telemetry assessment procedure when appropriate:1}  Medications Ordered in ED Medications - No data to display  ED Course/ Medical Decision Making/ A&P   {   Click here for ABCD2, HEART and other calculatorsREFRESH Note before signing :1}                              Medical Decision Making Amount and/or Complexity of Data Reviewed Labs: ordered.   ***  {Document critical care time when appropriate:1} {Document review of labs and clinical decision tools ie heart score, Chads2Vasc2 etc:1}  {Document your independent review of radiology images, and any outside records:1} {Document your discussion with family members, caretakers, and with consultants:1} {Document social determinants of health affecting pt's care:1} {Document your decision making why or why not admission, treatments were needed:1} Final Clinical Impression(s) / ED Diagnoses Final diagnoses:  None    Rx / DC Orders ED Discharge Orders     None

## 2022-11-05 LAB — URINALYSIS, ROUTINE W REFLEX MICROSCOPIC
Bilirubin Urine: NEGATIVE
Glucose, UA: NEGATIVE mg/dL
Ketones, ur: NEGATIVE mg/dL
Leukocytes,Ua: NEGATIVE
Nitrite: NEGATIVE
Protein, ur: NEGATIVE mg/dL
RBC / HPF: >50 RBC/hpf (ref 0–5)
Specific Gravity, Urine: 1.004 — ABNORMAL LOW (ref 1.005–1.030)
pH: 7 (ref 5.0–8.0)

## 2022-11-05 LAB — BASIC METABOLIC PANEL
Anion gap: 6 (ref 5–15)
BUN: 17 mg/dL (ref 6–20)
CO2: 21 mmol/L — ABNORMAL LOW (ref 22–32)
Calcium: 7.5 mg/dL — ABNORMAL LOW (ref 8.9–10.3)
Chloride: 109 mmol/L (ref 98–111)
Creatinine, Ser: 0.69 mg/dL (ref 0.44–1.00)
GFR, Estimated: >60 mL/min (ref >60)
Glucose, Bld: 83 mg/dL (ref 70–99)
Potassium: 3.5 mmol/L (ref 3.5–5.1)
Sodium: 136 mmol/L (ref 135–145)

## 2022-11-05 LAB — TYPE AND SCREEN
ABO/RH(D): A POS
Antibody Screen: NEGATIVE

## 2022-11-05 LAB — PREGNANCY, URINE: Preg Test, Ur: NEGATIVE

## 2022-11-05 MED ORDER — SODIUM CHLORIDE 0.9 % IV BOLUS
1000.0000 mL | Freq: Once | INTRAVENOUS | Status: AC
Start: 2022-11-05 — End: 2022-11-05
  Administered 2022-11-04: 1000 mL via INTRAVENOUS

## 2022-11-05 NOTE — ED Notes (Signed)
Pt provided with a [place] sandwich, apple juice, and apple sauce.

## 2022-11-05 NOTE — Discharge Instructions (Signed)
You were seen today for uterine bleeding.  Your hemoglobin is slightly downtrending but not at a dangerous level at this point.  Continue iron and Megace.  Follow-up with your OB/GYN.

## 2022-11-19 ENCOUNTER — Other Ambulatory Visit: Payer: Self-pay | Admitting: Obstetrics & Gynecology

## 2022-11-28 ENCOUNTER — Encounter (HOSPITAL_BASED_OUTPATIENT_CLINIC_OR_DEPARTMENT_OTHER): Payer: Self-pay | Admitting: Obstetrics & Gynecology

## 2022-11-28 ENCOUNTER — Other Ambulatory Visit: Payer: Self-pay

## 2022-11-28 DIAGNOSIS — Z01812 Encounter for preprocedural laboratory examination: Secondary | ICD-10-CM | POA: Diagnosis present

## 2022-11-28 NOTE — Progress Notes (Signed)
Spoke w/ via phone for pre-op interview---Lauren Castro needs dos---- UPT        Castro results------12/02/22 Castro appt for cbc, type & screen, bmp, 11/04/2022 EKG in chart & Epic COVID test -----patient states asymptomatic no test needed Arrive at -------0830 on Thursday, 12/04/2022 NPO after MN NO Solid Food.  Clear liquids from MN until---0730 Med rec completed Medications to take morning of surgery -----Lexapro Diabetic medication ----none Patient instructed no nail polish to be worn day of surgery Patient instructed to bring photo id and insurance card day of surgery Patient aware to have Driver (ride ) / caregiver    for 24 hours after surgery - husband, Lauren Castro Patient Special Instructions -----Extended / overnight stay instructions given. Pre-Op special Instructions -----none Patient verbalized understanding of instructions that were given at this phone interview. Patient denies chest pain, sob, fever, cough at the interview.

## 2022-11-28 NOTE — Progress Notes (Signed)
Your procedure is scheduled on Thursday, 12/04/2022.  Report to [facility] [place] AT  8:30 AM.   Call this number if you have problems the morning of surgery  [phone].   OUR ADDRESS IS [address].  WE ARE LOCATED IN [facility].  PLEASE BRING YOUR INSURANCE CARD AND PHOTO ID DAY OF SURGERY.  ONLY 2 PEOPLE ARE ALLOWED IN  WAITING  ROOM                                      REMEMBER:  DO NOT EAT FOOD, CANDY GUM OR MINTS  AFTER MIDNIGHT THE NIGHT BEFORE YOUR SURGERY . YOU MAY HAVE CLEAR LIQUIDS FROM MIDNIGHT THE NIGHT BEFORE YOUR SURGERY UNTIL 7:30 AM. NO CLEAR LIQUIDS AFTER   7:30 AM DAY OF SURGERY.  YOU MAY  BRUSH YOUR TEETH MORNING OF SURGERY AND RINSE YOUR MOUTH OUT, NO CHEWING GUM CANDY OR MINTS.     CLEAR LIQUID DIET    Allowed      Water                                                                   Coffee and tea, regular and decaf  (NO cream or milk products of any type, may sweeten)                         Carbonated beverages, regular and diet                                    Sports drinks like Gatorade _____________________________________________________________________     TAKE ONLY THESE MEDICATIONS MORNING OF SURGERY: escitalopram                                        DO NOT WEAR JEWERLY/  METAL/  PIERCINGS (INCLUDING NO PLASTIC PIERCINGS) DO NOT WEAR LOTIONS, POWDERS, PERFUMES OR NAIL POLISH ON YOUR FINGERNAILS. TOENAIL POLISH IS OK TO WEAR. DO NOT SHAVE FOR 48 HOURS PRIOR TO DAY OF SURGERY.  CONTACTS, GLASSES, OR DENTURES MAY NOT BE WORN TO SURGERY.  REMEMBER: NO SMOKING, VAPING ,  DRUGS OR ALCOHOL FOR 24 HOURS BEFORE YOUR SURGERY.                                    [place] IS NOT RESPONSIBLE  FOR ANY BELONGINGS.                                                                    Lauren Castro Kitchen           Lauren Castro - Preparing for Surgery Before surgery, you  can play an important role.  Because skin is not  sterile, your skin needs to be as free of germs as possible.  You can reduce the number of germs on your skin by washing with CHG (chlorahexidine gluconate) soap before surgery.  CHG is an antiseptic cleaner which kills germs and bonds with the skin to continue killing germs even after washing. Please DO NOT use if you have an allergy to CHG or antibacterial soaps.  If your skin becomes reddened/irritated stop using the CHG and inform your nurse when you arrive at Short Stay. Do not shave (including legs and underarms) for at least 48 hours prior to the first CHG shower.  You may shave your face/neck. Please follow these instructions carefully:  1.  Shower with CHG Soap the night before surgery and the  morning of Surgery.  2.  If you choose to wash your hair, wash your hair first as usual with your  normal  shampoo.  3.  After you shampoo, rinse your hair and body thoroughly to remove the  shampoo.                                        4.  Use CHG as you would any other liquid soap.  You can apply chg directly  to the skin and wash , chg soap provided, night before and morning of your surgery.  5.  Apply the CHG Soap to your body ONLY FROM THE NECK DOWN.   Do not use on face/ open                           Wound or open sores. Avoid contact with eyes, ears mouth and genitals (private parts).                       Wash face,  Genitals (private parts) with your normal soap.             6.  Wash thoroughly, paying special attention to the area where your surgery  will be performed.  7.  Thoroughly rinse your body with warm water from the neck down.  8.  DO NOT shower/wash with your normal soap after using and rinsing off  the CHG Soap.             9.  Pat yourself dry with a clean towel.            10.  Wear clean pajamas.            11.  Place clean sheets on your bed the night of your first shower and do not  sleep with pets. Day of Surgery : Do not apply any lotions/ powders the morning of  surgery.  Please wear clean clothes to the hospital/surgery center.  IF YOU HAVE ANY SKIN IRRITATION OR PROBLEMS WITH THE SURGICAL SOAP, PLEASE GET A BAR OF GOLD DIAL SOAP AND SHOWER THE NIGHT BEFORE YOUR SURGERY AND THE MORNING OF YOUR SURGERY. PLEASE LET THE NURSE KNOW MORNING OF YOUR SURGERY IF YOU HAD ANY PROBLEMS WITH THE SURGICAL SOAP.   YOUR SURGEON MAY HAVE REQUESTED EXTENDED RECOVERY TIME AFTER YOUR SURGERY. IT COULD BE A  JUST A FEW HOURS  UP TO AN OVERNIGHT STAY.  YOUR SURGEON SHOULD HAVE DISCUSSED THIS WITH YOU PRIOR TO YOUR SURGERY. IN  THE EVENT YOU NEED TO STAY OVERNIGHT PLEASE REFER TO THE FOLLOWING GUIDELINES. YOU MAY HAVE UP TO 4 VISITORS  MAY VISIT IN THE EXTENDED RECOVERY ROOM UNTIL 800 PM ONLY.  ONE  VISITOR AGE 67 AND OVER MAY SPEND THE NIGHT AND MUST BE IN EXTENDED RECOVERY ROOM NO LATER THAN 800 PM . YOUR DISCHARGE TIME AFTER YOU SPEND THE NIGHT IS 900 AM THE MORNING AFTER YOUR SURGERY. YOU MAY PACK A SMALL OVERNIGHT BAG WITH TOILETRIES FOR YOUR OVERNIGHT STAY IF YOU WISH.  REGARDLESS OF IF YOU STAY OVER NIGHT OR ARE DISCHARGED THE SAME DAY YOU WILL BE REQUIRED TO HAVE A RESPONSIBLE ADULT (18 YRS OLD OR OLDER) STAY WITH YOU FOR AT LEAST THE FIRST 24 HOURS  YOUR PRESCRIPTION MEDICATIONS WILL BE PROVIDED DURING YOUR HOSPITAL STAY.  ________________________________________________________________________                                                        QUESTIONS Lauren Castro PRE OP NURSE PHONE [phone].

## 2022-12-02 ENCOUNTER — Encounter (HOSPITAL_COMMUNITY)
Admission: RE | Admit: 2022-12-02 | Discharge: 2022-12-02 | Disposition: A | Discharge status: Home / Self Care | Payer: BC Managed Care – PPO | Source: Ambulatory Visit | Attending: Obstetrics & Gynecology | Admitting: Obstetrics & Gynecology

## 2022-12-02 DIAGNOSIS — Z01812 Encounter for preprocedural laboratory examination: Secondary | ICD-10-CM | POA: Insufficient documentation

## 2022-12-02 LAB — CBC
HCT: 33.7 % — ABNORMAL LOW (ref 36.0–46.0)
Hemoglobin: 11.2 g/dL — ABNORMAL LOW (ref 12.0–15.0)
MCH: 32.8 pg (ref 26.0–34.0)
MCHC: 33.2 g/dL (ref 30.0–36.0)
MCV: 98.8 fL (ref 80.0–100.0)
Platelets: 262 10*3/uL (ref 150–400)
RBC: 3.41 MIL/uL — ABNORMAL LOW (ref 3.87–5.11)
RDW: 13.2 % (ref 11.5–15.5)
WBC: 6.9 10*3/uL (ref 4.0–10.5)
nRBC: 0 % (ref 0.0–0.2)

## 2022-12-02 LAB — TYPE AND SCREEN
ABO/RH(D): A POS
Antibody Screen: NEGATIVE

## 2022-12-02 LAB — BASIC METABOLIC PANEL
Anion gap: 8 (ref 5–15)
BUN: 20 mg/dL (ref 6–20)
CO2: 22 mmol/L (ref 22–32)
Calcium: 8.9 mg/dL (ref 8.9–10.3)
Chloride: 107 mmol/L (ref 98–111)
Creatinine, Ser: 0.71 mg/dL (ref 0.44–1.00)
GFR, Estimated: >60 mL/min (ref >60)
Glucose, Bld: 80 mg/dL (ref 70–99)
Potassium: 3.7 mmol/L (ref 3.5–5.1)
Sodium: 137 mmol/L (ref 135–145)

## 2022-12-02 NOTE — Anesthesia Preprocedure Evaluation (Signed)
Anesthesia Evaluation  Patient identified by MRN, date of birth, ID band Patient awake    Reviewed: Allergy & Precautions, NPO status , Patient's Chart, lab work & pertinent test results  Airway Mallampati: I  TM Distance: >3 FB Neck ROM: Full    Dental  (+) Teeth Intact, Dental Advisory Given   Pulmonary asthma (childhood only, does not have rescue inhaler)    Pulmonary exam normal breath sounds clear to auscultation       Cardiovascular negative cardio ROS Normal cardiovascular exam Rhythm:Regular Rate:Normal     Neuro/Psych  PSYCHIATRIC DISORDERS Anxiety Depression    negative neurological ROS     GI/Hepatic negative GI ROS, Neg liver ROS,,,  Endo/Other  negative endocrine ROS    Renal/GU negative Renal ROS  negative genitourinary   Musculoskeletal negative musculoskeletal ROS (+)    Abdominal   Peds  Hematology  (+) Blood dyscrasia, anemia Hb 11.2   Anesthesia Other Findings   Reproductive/Obstetrics AUB                             Anesthesia Physical Anesthesia Plan  ASA: 1  Anesthesia Plan: General   Post-op Pain Management: Tylenol PO (pre-op)*, Toradol IV (intra-op)*, Precedex, Ketamine IV* and Dilaudid IV   Induction: Intravenous  PONV Risk Score and Plan: 4 or greater and Ondansetron, Dexamethasone and Midazolam  Airway Management Planned: Oral ETT  Additional Equipment: None  Intra-op Plan:   Post-operative Plan: Extubation in OR  Informed Consent: I have reviewed the patients History and Physical, chart, labs and discussed the procedure including the risks, benefits and alternatives for the proposed anesthesia with the patient or authorized representative who has indicated his/her understanding and acceptance.     Dental advisory given  Plan Discussed with: CRNA  Anesthesia Plan Comments:        Anesthesia Quick Evaluation

## 2022-12-02 NOTE — H&P (Incomplete)
Lauren Castro is an 42 y.o. female here for hysterectomy for uterine fibroids with menorrhagia pt with heavy periods since almost 1 year and severe cramps, has large fibroid.  She went to ER recently for heavy bleeding, being dizzy and fatigued. Bleeding is holding up with daily Megace. desires hysterectomy. declines wanting more kids, declines UFE option. denies hot flashes.  Pelvic sono-  uterus with fibroids, slightly enlarged, 125 cc, fibroids are mostly intramural, 2.7cm, 3.9 cm, 0.8 cm, and one subserosal f3.3 cm. Complex cyst left ovary with peripheral flow consistent with corpus luteum.Marland Kitchen no ff noted   Pertinent Gynecological History: Nl Paps. No breast problems.   Patient's last menstrual period was 11/18/2022 (exact date).    Past Medical History:  Diagnosis Date   Abnormal uterine bleeding 2024   Anemia 2024   Anxiety    Follows w/ PCP.   Asthma    just as a child   Depression    Follows with PCP, Dr. Farris Has.   Feeling of chest tightness    Pt states occurs randomly. Thought to be related to anxiety. Patient stated that she went to her doctor and had an ekg. She was told that it was normal. Patient exercises regularly with no problems.   Low back pain    arthritis   Wrist pain, right     Past Surgical History:  Procedure Laterality Date   BREAST EXCISIONAL BIOPSY Left 2009   benign   COLONOSCOPY  2007    Family History  Problem Relation Age of Onset   Stroke Neg Hx     Social History:  reports that she has never smoked. She has never used smokeless tobacco. She reports current alcohol use of about 5.0 standard drinks of alcohol per week. She reports that she does not use drugs.  Allergies: No Known Allergies                                                                                                                          Review of Systems  Height 5\' 3"  (1.6 m), weight 55.8 kg, last menstrual period 11/18/2022. Physical Exam BP 122/74   Pulse  (!) 57   Temp 97.7 F (36.5 C) (Oral)   Resp 16   Ht 5\' 3"  (1.6 m)   Wt 55.7 kg   LMP 11/18/2022 (Exact Date)   SpO2 100%   BMI 21.77 kg/m   A&O x 3, no acute distress. Pleasant HEENT neg, no thyromegaly Lungs CTA bilat CV RRR, S1S2 normal Abdo soft, non tender, non acute Extr no edema/ tenderness Pelvic  Uterus 10 wk, mobile, fibroids, nl cervix and adnexa   Results for orders placed or performed during the hospital encounter of 12/02/22 (from the past 24 hour(s))  CBC     Status: Abnormal   Collection Time: 12/02/22  2:04 PM  Result Value Ref Range   WBC 6.9 4.0 - 10.5 K/uL   RBC 3.41 (L) 3.87 - 5.11  MIL/uL   Hemoglobin 11.2 (L) 12.0 - 15.0 g/dL   HCT 40.9 (L) 81.1 - 91.4 %   MCV 98.8 80.0 - 100.0 fL   MCH 32.8 26.0 - 34.0 pg   MCHC 33.2 30.0 - 36.0 g/dL   RDW 78.2 95.6 - 21.3 %   Platelets 262 150 - 400 K/uL   nRBC 0.0 0.0 - 0.2 %  Type and screen     Status: None   Collection Time: 12/02/22  2:04 PM  Result Value Ref Range   ABO/RH(D) A POS    Antibody Screen NEG    Sample Expiration 12/04/2022,2359    Extend sample reason      NO TRANSFUSIONS OR PREGNANCY IN THE PAST 3 MONTHS Performed at [facility], [address], [place], [place] [address]   Basic metabolic panel     Status: None   Collection Time: 12/02/22  2:04 PM  Result Value Ref Range   Sodium 137 135 - 145 mmol/L   Potassium 3.7 3.5 - 5.1 mmol/L   Chloride 107 98 - 111 mmol/L   CO2 22 22 - 32 mmol/L   Glucose, Bld 80 70 - 99 mg/dL   BUN 20 6 - 20 mg/dL   Creatinine, Ser 8.46 0.44 - 1.00 mg/dL   Calcium 8.9 8.9 - 96.2 mg/dL   GFR, Estimated >95 >28 mL/min   Anion gap 8 5 - 15    No results found.  Assessment/Plan: 42 yo female with uterine fibroid and menorrhagia, here for daVinci assisted laparoscopic hysterectomy and bilateral salpingectomy Risks/complications of surgery reviewed incl infection, bleeding, damage to internal organs including bladder, bowels, ureters,  blood vessels, other risks from anesthesia, VTE and delayed complications of any surgery, complications in future surgery reviewed.    Robley Fries MD

## 2022-12-04 ENCOUNTER — Ambulatory Visit (HOSPITAL_BASED_OUTPATIENT_CLINIC_OR_DEPARTMENT_OTHER): Payer: Self-pay | Admitting: Anesthesiology

## 2022-12-04 ENCOUNTER — Ambulatory Visit (HOSPITAL_BASED_OUTPATIENT_CLINIC_OR_DEPARTMENT_OTHER)
Admission: RE | Admit: 2022-12-04 | Discharge: 2022-12-04 | Disposition: A | Discharge status: Home / Self Care | Payer: BC Managed Care – PPO | Attending: Obstetrics & Gynecology | Admitting: Obstetrics & Gynecology

## 2022-12-04 ENCOUNTER — Other Ambulatory Visit: Payer: Self-pay

## 2022-12-04 ENCOUNTER — Ambulatory Visit (HOSPITAL_BASED_OUTPATIENT_CLINIC_OR_DEPARTMENT_OTHER): Payer: BC Managed Care – PPO | Admitting: Anesthesiology

## 2022-12-04 ENCOUNTER — Encounter (HOSPITAL_BASED_OUTPATIENT_CLINIC_OR_DEPARTMENT_OTHER)
Admission: RE | Disposition: A | Discharge status: Home / Self Care | Payer: Self-pay | Source: Home / Self Care | Attending: Obstetrics & Gynecology

## 2022-12-04 ENCOUNTER — Encounter (HOSPITAL_BASED_OUTPATIENT_CLINIC_OR_DEPARTMENT_OTHER): Payer: Self-pay | Admitting: Obstetrics & Gynecology

## 2022-12-04 DIAGNOSIS — N838 Other noninflammatory disorders of ovary, fallopian tube and broad ligament: Secondary | ICD-10-CM | POA: Insufficient documentation

## 2022-12-04 DIAGNOSIS — Z01818 Encounter for other preprocedural examination: Secondary | ICD-10-CM

## 2022-12-04 DIAGNOSIS — D219 Benign neoplasm of connective and other soft tissue, unspecified: Secondary | ICD-10-CM | POA: Diagnosis present

## 2022-12-04 DIAGNOSIS — D649 Anemia, unspecified: Secondary | ICD-10-CM | POA: Insufficient documentation

## 2022-12-04 DIAGNOSIS — N920 Excessive and frequent menstruation with regular cycle: Secondary | ICD-10-CM | POA: Diagnosis not present

## 2022-12-04 DIAGNOSIS — N8003 Adenomyosis of the uterus: Secondary | ICD-10-CM | POA: Diagnosis not present

## 2022-12-04 DIAGNOSIS — D251 Intramural leiomyoma of uterus: Secondary | ICD-10-CM | POA: Diagnosis present

## 2022-12-04 DIAGNOSIS — D252 Subserosal leiomyoma of uterus: Secondary | ICD-10-CM | POA: Insufficient documentation

## 2022-12-04 HISTORY — DX: Pain in right wrist: M25.531

## 2022-12-04 HISTORY — DX: Low back pain, unspecified: M54.50

## 2022-12-04 HISTORY — DX: Other chest pain: R07.89

## 2022-12-04 HISTORY — DX: Depression, unspecified: F32.A

## 2022-12-04 HISTORY — PX: ROBOTIC ASSISTED LAPAROSCOPIC HYSTERECTOMY AND SALPINGECTOMY: SHX6379

## 2022-12-04 HISTORY — DX: Excessive and frequent menstruation with regular cycle: N92.0

## 2022-12-04 LAB — POCT PREGNANCY, URINE: Preg Test, Ur: NEGATIVE

## 2022-12-04 SURGERY — XI ROBOTIC ASSISTED LAPAROSCOPIC HYSTERECTOMY AND SALPINGECTOMY
Anesthesia: General | Site: Abdomen | Laterality: Bilateral

## 2022-12-04 MED ORDER — MIDAZOLAM HCL 2 MG/2ML IJ SOLN
INTRAMUSCULAR | Status: AC
  Filled 2022-12-04: qty 2

## 2022-12-04 MED ORDER — ACETAMINOPHEN 325 MG PO TABS
650.0000 mg | ORAL_TABLET | ORAL | Status: DC | PRN
  Administered 2022-12-04: 650 mg via ORAL

## 2022-12-04 MED ORDER — SCOPOLAMINE 1 MG/3DAYS TD PT72
1.0000 | MEDICATED_PATCH | TRANSDERMAL | Status: DC
  Administered 2022-12-04: 1.5 mg via TRANSDERMAL

## 2022-12-04 MED ORDER — ACETAMINOPHEN 500 MG PO TABS
1000.0000 mg | ORAL_TABLET | Freq: Once | ORAL | Status: AC
  Administered 2022-12-04: 1000 mg via ORAL

## 2022-12-04 MED ORDER — DEXAMETHASONE SODIUM PHOSPHATE 10 MG/ML IJ SOLN
INTRAMUSCULAR | Status: DC | PRN
  Administered 2022-12-04: 10 mg via INTRAVENOUS

## 2022-12-04 MED ORDER — PROPOFOL 10 MG/ML IV BOLUS
INTRAVENOUS | Status: DC | PRN
  Administered 2022-12-04: 150 mg via INTRAVENOUS

## 2022-12-04 MED ORDER — ACETAMINOPHEN 325 MG PO TABS
ORAL_TABLET | ORAL | Status: AC
  Filled 2022-12-04: qty 2

## 2022-12-04 MED ORDER — MENTHOL 3 MG MT LOZG: 1.0000 | LOZENGE | OROMUCOSAL | Status: DC | PRN

## 2022-12-04 MED ORDER — FENTANYL CITRATE (PF) 250 MCG/5ML IJ SOLN
INTRAMUSCULAR | Status: DC | PRN
  Administered 2022-12-04 (×3): 25 ug via INTRAVENOUS
  Administered 2022-12-04: 50 ug via INTRAVENOUS
  Administered 2022-12-04 (×3): 25 ug via INTRAVENOUS

## 2022-12-04 MED ORDER — DIPHENHYDRAMINE HCL 50 MG/ML IJ SOLN
INTRAMUSCULAR | Status: DC | PRN
  Administered 2022-12-04: 12.5 mg via INTRAVENOUS

## 2022-12-04 MED ORDER — KETOROLAC TROMETHAMINE 30 MG/ML IJ SOLN
30.0000 mg | Freq: Four times a day (QID) | INTRAMUSCULAR | Status: DC
Start: 2022-12-04 — End: 2022-12-05

## 2022-12-04 MED ORDER — ONDANSETRON HCL 4 MG/2ML IJ SOLN
INTRAMUSCULAR | Status: DC | PRN
  Administered 2022-12-04: 4 mg via INTRAVENOUS

## 2022-12-04 MED ORDER — KETOROLAC TROMETHAMINE 30 MG/ML IJ SOLN
INTRAMUSCULAR | Status: DC | PRN
  Administered 2022-12-04: 30 mg via INTRAVENOUS

## 2022-12-04 MED ORDER — EPHEDRINE SULFATE-NACL 50-0.9 MG/10ML-% IV SOSY
PREFILLED_SYRINGE | INTRAVENOUS | Status: DC | PRN
  Administered 2022-12-04 (×2): 5 mg via INTRAVENOUS

## 2022-12-04 MED ORDER — SCOPOLAMINE 1 MG/3DAYS TD PT72
MEDICATED_PATCH | TRANSDERMAL | Status: AC
  Filled 2022-12-04: qty 1

## 2022-12-04 MED ORDER — OXYCODONE HCL 5 MG/5ML PO SOLN: 5.0000 mg | Freq: Once | ORAL | Status: DC | PRN

## 2022-12-04 MED ORDER — PHENYLEPHRINE HCL-NACL 20-0.9 MG/250ML-% IV SOLN
INTRAVENOUS | Status: DC | PRN
  Administered 2022-12-04: 15 ug/min via INTRAVENOUS

## 2022-12-04 MED ORDER — SODIUM CHLORIDE 0.9 % IV SOLN: INTRAVENOUS | Status: DC | PRN

## 2022-12-04 MED ORDER — POVIDONE-IODINE 10 % EX SWAB: 2.0000 | Freq: Once | CUTANEOUS | Status: DC

## 2022-12-04 MED ORDER — OXYCODONE HCL 5 MG PO TABS: 5.0000 mg | ORAL_TABLET | ORAL | Status: DC | PRN

## 2022-12-04 MED ORDER — FENTANYL CITRATE (PF) 250 MCG/5ML IJ SOLN
INTRAMUSCULAR | Status: AC
  Filled 2022-12-04: qty 5

## 2022-12-04 MED ORDER — ONDANSETRON HCL 4 MG/2ML IJ SOLN: 4.0000 mg | Freq: Once | INTRAMUSCULAR | Status: DC | PRN

## 2022-12-04 MED ORDER — ROCURONIUM BROMIDE 10 MG/ML (PF) SYRINGE
PREFILLED_SYRINGE | INTRAVENOUS | Status: DC | PRN
  Administered 2022-12-04: 20 mg via INTRAVENOUS
  Administered 2022-12-04: 50 mg via INTRAVENOUS
  Administered 2022-12-04: 20 mg via INTRAVENOUS
  Administered 2022-12-04: 10 mg via INTRAVENOUS

## 2022-12-04 MED ORDER — SUGAMMADEX SODIUM 200 MG/2ML IV SOLN
INTRAVENOUS | Status: DC | PRN
  Administered 2022-12-04: 150 mg via INTRAVENOUS

## 2022-12-04 MED ORDER — SIMETHICONE 80 MG PO CHEW
CHEWABLE_TABLET | ORAL | Status: AC
  Filled 2022-12-04: qty 1

## 2022-12-04 MED ORDER — IBUPROFEN 600 MG PO TABS: 600.0000 mg | ORAL_TABLET | Freq: Four times a day (QID) | ORAL | 0 refills | Status: AC

## 2022-12-04 MED ORDER — PROPOFOL 10 MG/ML IV BOLUS
INTRAVENOUS | Status: AC
  Filled 2022-12-04: qty 20

## 2022-12-04 MED ORDER — HYDROMORPHONE HCL 1 MG/ML IJ SOLN: 0.2500 mg | INTRAMUSCULAR | Status: DC | PRN

## 2022-12-04 MED ORDER — SODIUM CHLORIDE (PF) 0.9 % IJ SOLN
INTRAMUSCULAR | Status: DC | PRN
  Administered 2022-12-04: 60 mL via SURGICAL_CAVITY

## 2022-12-04 MED ORDER — CEFAZOLIN SODIUM-DEXTROSE 2-4 GM/100ML-% IV SOLN
2.0000 g | INTRAVENOUS | Status: AC
  Administered 2022-12-04: 2 g via INTRAVENOUS

## 2022-12-04 MED ORDER — ACETAMINOPHEN 325 MG PO TABS: 650.0000 mg | ORAL_TABLET | ORAL | Status: AC | PRN

## 2022-12-04 MED ORDER — CEFAZOLIN SODIUM-DEXTROSE 2-4 GM/100ML-% IV SOLN
INTRAVENOUS | Status: AC
  Filled 2022-12-04: qty 100

## 2022-12-04 MED ORDER — IBUPROFEN 200 MG PO TABS: 600.0000 mg | ORAL_TABLET | Freq: Four times a day (QID) | ORAL | Status: DC

## 2022-12-04 MED ORDER — ACETAMINOPHEN 500 MG PO TABS
ORAL_TABLET | ORAL | Status: AC
  Filled 2022-12-04: qty 2

## 2022-12-04 MED ORDER — ONDANSETRON HCL 4 MG PO TABS: 4.0000 mg | ORAL_TABLET | Freq: Four times a day (QID) | ORAL | Status: DC | PRN

## 2022-12-04 MED ORDER — AMISULPRIDE (ANTIEMETIC) 5 MG/2ML IV SOLN: 10.0000 mg | Freq: Once | INTRAVENOUS | Status: DC | PRN

## 2022-12-04 MED ORDER — OXYCODONE HCL 5 MG PO TABS: 5.0000 mg | ORAL_TABLET | Freq: Four times a day (QID) | ORAL | 0 refills | Status: AC | PRN

## 2022-12-04 MED ORDER — MIDAZOLAM HCL 2 MG/2ML IJ SOLN
INTRAMUSCULAR | Status: DC | PRN
  Administered 2022-12-04: 2 mg via INTRAVENOUS

## 2022-12-04 MED ORDER — SODIUM CHLORIDE 0.9 % IR SOLN
Status: DC | PRN
  Administered 2022-12-04: 200 mL

## 2022-12-04 MED ORDER — SIMETHICONE 80 MG PO CHEW
80.0000 mg | CHEWABLE_TABLET | Freq: Four times a day (QID) | ORAL | Status: DC | PRN
  Administered 2022-12-04: 80 mg via ORAL

## 2022-12-04 MED ORDER — LIDOCAINE 2% (20 MG/ML) 5 ML SYRINGE
INTRAMUSCULAR | Status: DC | PRN
  Administered 2022-12-04: 50 mg via INTRAVENOUS

## 2022-12-04 MED ORDER — OXYCODONE HCL 5 MG PO TABS: 5.0000 mg | ORAL_TABLET | Freq: Once | ORAL | Status: DC | PRN

## 2022-12-04 MED ORDER — KETOROLAC TROMETHAMINE 30 MG/ML IJ SOLN: 30.0000 mg | Freq: Once | INTRAMUSCULAR | Status: DC | PRN

## 2022-12-04 MED ORDER — ONDANSETRON HCL 4 MG/2ML IJ SOLN: 4.0000 mg | Freq: Four times a day (QID) | INTRAMUSCULAR | Status: DC | PRN

## 2022-12-04 MED ORDER — MEPERIDINE HCL 25 MG/ML IJ SOLN: 6.2500 mg | INTRAMUSCULAR | Status: DC | PRN

## 2022-12-04 MED ORDER — PHENYLEPHRINE 80 MCG/ML (10ML) SYRINGE FOR IV PUSH (FOR BLOOD PRESSURE SUPPORT)
PREFILLED_SYRINGE | INTRAVENOUS | Status: DC | PRN
  Administered 2022-12-04: 80 ug via INTRAVENOUS

## 2022-12-04 MED ORDER — STERILE WATER FOR IRRIGATION IR SOLN
Status: DC | PRN
  Administered 2022-12-04 (×2): 500 mL

## 2022-12-04 MED ORDER — LACTATED RINGERS IV SOLN: INTRAVENOUS | Status: DC

## 2022-12-04 SURGICAL SUPPLY — 63 items
APPLICATOR ARISTA FLEXITIP XL (MISCELLANEOUS) IMPLANT
BAG DECANTER FOR FLEXI CONT (MISCELLANEOUS) IMPLANT
BARRIER ADHS 3X4 INTERCEED (GAUZE/BANDAGES/DRESSINGS) IMPLANT
BLADE SURG 11 STRL SS (BLADE) IMPLANT
CATH FOLEY 3WAY 5CC 16FR (CATHETERS) IMPLANT
COVER BACK TABLE 60X90IN (DRAPES) ×1 IMPLANT
COVER TIP SHEARS 8 DVNC (MISCELLANEOUS) ×1 IMPLANT
DEFOGGER SCOPE WARMER CLEARIFY (MISCELLANEOUS) ×1 IMPLANT
DERMABOND ADVANCED .7 DNX12 (GAUZE/BANDAGES/DRESSINGS) ×1 IMPLANT
DRAPE ARM DVNC X/XI (DISPOSABLE) ×4 IMPLANT
DRAPE COLUMN DVNC XI (DISPOSABLE) ×1 IMPLANT
DRAPE SURG IRRIG POUCH 19X23 (DRAPES) ×1 IMPLANT
DRAPE UTILITY XL STRL (DRAPES) ×1 IMPLANT
DRIVER NDL MEGA SUTCUT DVNCXI (INSTRUMENTS) ×1 IMPLANT
DRIVER NDLE MEGA SUTCUT DVNCXI (INSTRUMENTS) ×1 IMPLANT
DURAPREP 26ML APPLICATOR (WOUND CARE) ×1 IMPLANT
ELECT REM PT RETURN 9FT ADLT (ELECTROSURGICAL) ×1
ELECTRODE REM PT RTRN 9FT ADLT (ELECTROSURGICAL) ×1 IMPLANT
FORCEPS BPLR LNG DVNC XI (INSTRUMENTS) ×1 IMPLANT
FORCEPS LONG TIP 8 DVNC XI (FORCEP) IMPLANT
FORCEPS PROGRASP DVNC XI (FORCEP) IMPLANT
GAUZE 4X4 16PLY ~~LOC~~+RFID DBL (SPONGE) IMPLANT
GAUZE PETROLATUM 1 X8 (GAUZE/BANDAGES/DRESSINGS) IMPLANT
GLOVE BIO SURGEON STRL SZ7 (GLOVE) ×3 IMPLANT
GLOVE BIOGEL PI IND STRL 5.5 (GLOVE) IMPLANT
GLOVE BIOGEL PI IND STRL 7.0 (GLOVE) ×3 IMPLANT
HEMOSTAT ARISTA ABSORB 3G PWDR (HEMOSTASIS) IMPLANT
HOLDER FOLEY CATH W/STRAP (MISCELLANEOUS) IMPLANT
IRRIG SUCT STRYKERFLOW 2 WTIP (MISCELLANEOUS) ×1
IRRIGATION SUCT STRKRFLW 2 WTP (MISCELLANEOUS) ×1 IMPLANT
IV NS 1000ML BAXH (IV SOLUTION) IMPLANT
KIT PINK PAD W/HEAD ARE REST (MISCELLANEOUS) ×1
KIT PINK PAD W/HEAD ARM REST (MISCELLANEOUS) ×1 IMPLANT
KIT TURNOVER CYSTO (KITS) ×1 IMPLANT
LEGGING LITHOTOMY PAIR STRL (DRAPES) ×1 IMPLANT
OBTURATOR OPTICAL STND 8 DVNC (TROCAR) ×1
OBTURATOR OPTICALSTD 8 DVNC (TROCAR) ×1 IMPLANT
OCCLUDER COLPOPNEUMO (BALLOONS) ×1 IMPLANT
PACK ROBOT WH (CUSTOM PROCEDURE TRAY) ×1 IMPLANT
PACK ROBOTIC GOWN (GOWN DISPOSABLE) ×1 IMPLANT
PAD OB MATERNITY 4.3X12.25 (PERSONAL CARE ITEMS) ×1 IMPLANT
PAD PREP 24X48 CUFFED NSTRL (MISCELLANEOUS) ×1 IMPLANT
PROTECTOR NERVE ULNAR (MISCELLANEOUS) ×1 IMPLANT
SCISSORS MNPLR CVD DVNC XI (INSTRUMENTS) ×1 IMPLANT
SEAL UNIV 5-12 XI (MISCELLANEOUS) ×3 IMPLANT
SEALER VESSEL EXT DVNC XI (MISCELLANEOUS) ×1 IMPLANT
SET IRRIG Y TYPE TUR BLADDER L (SET/KITS/TRAYS/PACK) IMPLANT
SET TRI-LUMEN FLTR TB AIRSEAL (TUBING) ×1 IMPLANT
SLEEVE SCD COMPRESS KNEE MED (STOCKING) ×1 IMPLANT
SPIKE FLUID TRANSFER (MISCELLANEOUS) ×2 IMPLANT
SUT VIC AB 4-0 PS2 18 (SUTURE) ×2 IMPLANT
SUT VICRYL 0 UR6 27IN ABS (SUTURE) ×1 IMPLANT
SUT VLOC 180 0 9IN GS21 (SUTURE) ×1 IMPLANT
TIP RUMI ORANGE 6.7MMX12CM (TIP) IMPLANT
TIP UTERINE 5.1X6CM LAV DISP (MISCELLANEOUS) IMPLANT
TIP UTERINE 6.7X10CM GRN DISP (MISCELLANEOUS) IMPLANT
TIP UTERINE 6.7X6CM WHT DISP (MISCELLANEOUS) IMPLANT
TIP UTERINE 6.7X8CM BLUE DISP (MISCELLANEOUS) IMPLANT
TOWEL OR 17X24 6PK STRL BLUE (TOWEL DISPOSABLE) ×1 IMPLANT
TRAY FOL W/BAG SLVR 16FR STRL (SET/KITS/TRAYS/PACK) IMPLANT
TROCAR PORT AIRSEAL 5X120 (TROCAR) ×1 IMPLANT
WATER STERILE IRR 1000ML POUR (IV SOLUTION) ×1 IMPLANT
WATER STERILE IRR 500ML POUR (IV SOLUTION) ×1 IMPLANT

## 2022-12-04 NOTE — Anesthesia Postprocedure Evaluation (Signed)
Anesthesia Post Note  Patient: Merikay Ferrante  Procedure(s) Performed: XI ROBOTIC ASSISTED LAPAROSCOPIC HYSTERECTOMY AND SALPINGECTOMY (Bilateral: Abdomen)     Patient location during evaluation: PACU Anesthesia Type: General Level of consciousness: awake and alert Pain management: pain level controlled Vital Signs Assessment: post-procedure vital signs reviewed and stable Respiratory status: spontaneous breathing, nonlabored ventilation and respiratory function stable Cardiovascular status: blood pressure returned to baseline and stable Postop Assessment: no apparent nausea or vomiting Anesthetic complications: no  No notable events documented.  Last Vitals:  Vitals:   12/04/22 1430 12/04/22 1445  BP: 114/72 118/70  Pulse: 79 78  Resp: 19   Temp:    SpO2: 100% 100%    Last Pain:  Vitals:   12/04/22 1450  TempSrc:   PainSc: 3                  Maryella Abood,W. EDMOND

## 2022-12-04 NOTE — Anesthesia Procedure Notes (Signed)
Procedure Name: Intubation Date/Time: 12/04/2022 10:39 AM  Performed by: Dairl Ponder, CRNAPre-anesthesia Checklist: Patient identified, Emergency Drugs available, Suction available and Patient being monitored Patient Re-evaluated:Patient Re-evaluated prior to induction Oxygen Delivery Method: Circle System Utilized Preoxygenation: Pre-oxygenation with 100% oxygen Induction Type: IV induction Ventilation: Mask ventilation without difficulty Laryngoscope Size: Mac and 3 Grade View: Grade I Tube type: Oral Tube size: 7.0 mm Number of attempts: 1 Airway Equipment and Method: Stylet and Oral airway Placement Confirmation: ETT inserted through vocal cords under direct vision, positive ETCO2 and breath sounds checked- equal and bilateral Secured at: 21 cm Tube secured with: Tape Dental Injury: Teeth and Oropharynx as per pre-operative assessment

## 2022-12-04 NOTE — Discharge Summary (Signed)
Physician Discharge Summary  Patient ID: Lauren Castro MRN: [id number] DOB/AGE: 44-02-1980 44 y.o.  Admit date: 12/04/2022 Discharge date: 12/04/2022  Admission Diagnoses: Fibroids, menorrhagia  Discharge Diagnoses: S/p da vinci assisted total laparoscopic hysterectomy and bilateral salpingectomy   Discharged Condition: good  Hospital Course: Uncomplicated post op recovery. Ambulating, voiding, eating, pain well controlled.  BP 118/71 (BP Location: Right Arm)   Pulse 64   Temp 98.8 F (37.1 C)   Resp 16   Ht 5\' 3"  (1.6 m)   Wt 55.7 kg   LMP 11/18/2022 (Exact Date)   SpO2 97%   BMI 21.77 kg/m   General appearance: alert and cooperative Head: Normocephalic, without obvious abnormality Resp: clear to auscultation bilaterally Cardio: regular rate and rhythm, S1, S2 normal, no murmur, click, rub or gallop GI: soft, non-tender; bowel sounds normal; no masses,  no organomegaly Incision/Wound: clean dry  Disposition: Discharge disposition: 01-Home or Self Care       Discharge Instructions     Call MD for:   Complete by: As directed    Vaginal bleeding more that few spots   Call MD for:  difficulty breathing, headache or visual disturbances   Complete by: As directed    Call MD for:  hives   Complete by: As directed    Call MD for:  persistant dizziness or light-headedness   Complete by: As directed    Call MD for:  persistant nausea and vomiting   Complete by: As directed    Call MD for:  redness, tenderness, or signs of infection (pain, swelling, redness, odor or green/yellow discharge around incision site)   Complete by: As directed    Call MD for:  severe uncontrolled pain   Complete by: As directed    Call MD for:  temperature >100.4   Complete by: As directed    Diet - low sodium heart healthy   Complete by: As directed    Driving Restrictions   Complete by: As directed    2 weeks   Increase activity slowly   Complete by: As directed    Lifting  restrictions   Complete by: As directed    10 pounds only 6 weeks   Sexual Activity Restrictions   Complete by: As directed    6 weeks      Allergies as of 12/04/2022   No Known Allergies      Medication List     TAKE these medications    acetaminophen 325 MG tablet Commonly known as: TYLENOL Take 2 tablets (650 mg total) by mouth every 4 (four) hours as needed for mild pain (pain score 1-3) (temperature > 101.5.).   calcium-vitamin D 500-5 MG-MCG tablet Commonly known as: OSCAL WITH D Take 1 tablet by mouth.   COLLAGEN PO Take by mouth daily at 12 noon. Vital Protein collagen powder.   escitalopram 10 MG tablet Commonly known as: LEXAPRO Take 10 mg by mouth daily.   ferrous sulfate 324 MG Tbec Take 324 mg by mouth.   ibuprofen 200 MG tablet Commonly known as: ADVIL Take 400 mg by mouth every 8 (eight) hours as needed. What changed: Another medication with the same name was added. Make sure you understand how and when to take each.   ibuprofen 600 MG tablet Commonly known as: ADVIL Take 1 tablet (600 mg total) by mouth every 6 (six) hours. Start taking on: December 05, 2022 What changed: You were already taking a medication with the same name, and this  prescription was added. Make sure you understand how and when to take each.   multivitamin with minerals Tabs tablet Take 1 tablet by mouth daily. One a Day   oxyCODONE 5 MG immediate release tablet Commonly known as: Oxy IR/ROXICODONE Take 1 tablet (5 mg total) by mouth every 6 (six) hours as needed for up to 3 days for moderate pain (pain score 4-6).   vitamin C 1000 MG tablet Take 1,000 mg by mouth daily. Takes 1 1/2 tablet daily.        Follow-up Information     Shea Evans, MD Follow up in 2 week(s).   Specialty: Obstetrics and Gynecology Contact information: [address] [place] [address] [phone]                 Signed: Robley Fries 12/04/2022, 6:39 PM

## 2022-12-04 NOTE — Op Note (Signed)
12/04/22 Preoperative diagnosis:  Uterine fibroids, menorrhagia, anemia  Postop diagnosis: Same Procedure: da Vinci robot assisted total laparoscopic hysterectomy and bilateral salpingectomy Anesthesia Gen. Endotracheal Surgeon: Dr. Shea Evans Assistant: Dr Rhoderick Moody MD IV fluids: 1000 cc LR EBL: 100 cc  Urine output: 300 cc, clear Complications: none Pathology: Uterus with cervix and both fallopian tubes Disposition: PACU, stable Findings: Enlarged uterus with several bilateral fibroids. Normal ovaries and fallopian tubes.  Normal peristalsing ureters bilaterally at the start and end of the case/   Procedure:  Indication: --Menorrhagia, pain, anemia from fibroids. Failed medical therapy, desiring hysterectomy.  Risks/ complications of surgery including infection, bleeding, damage to internal organs and other surgery related problems including pneumonia, VTE reviewed and informed written consent was obtained. She understood and gave informed written consent.  Patient was brought to the operating room with IV running. She received 2 gm Ancef . She underwent general anesthesia without difficulty and was given dorsal lithotomy position, prepped and draped in sterile fashion. Foley catheter was placed. Cervix was exposed with a speculum and anterior lip of the cervix was grasped with tenaculum. Uterus was sounded to 12 cm. A  # 10 Rumi tip and a small Koh ring was assembled on the [employer] and entered in the uterine cavity and balloon was inflated to secure it in place. Koh ring was palpated again cervico-vaginal junction. Speculum was removed, tenaculum was left on the cervix.  Attention was focused on abdomen. An umbilical 8 mm vertical incision made with scalpel after injecting Ropivacaine,  fascia dissected, grasped with Kocher's and incised, posterior rectus sheath and peritoneum grasped, incised, intraabdominal entry confirmed. Purse string stay stitch on 0-Vicryl taken on fascia  and Robot cannula with reducer inserted. Pneumoperitoneum begun. Laparoscope was introduced and the peritoneal cavity was evaluated. There was no evidence of adhesions. Large multifibroiid uterus noted Trendelenburg position given.  Port sited marked and injected with Ropivacaine. Two Robotic cannulas inserted on right side and one on the left side under vision and one Airflow inserted on left. Robot was docked from the right. Arm 1 had Bipolar fenestrated grasper, 2 with camera, 3 with scissors and 4 with Prograsp.  Dr. Juliene Pina scrubbed out and went for surgical console and Dr Amado Nash remained at bedside.  Uterine mobility was limited to due to its bilateral lateral myomas but was still able to get adequate visualization of lateral pelvic walls.  Uterus was deviated to the patient's right  The left fallopian tube grasped and mesosalpinx desiccated and cut. Then Left utero-ovarian ligament was desiccated and cut and then left round ligament. Anterior and posterior broad ligament dissection started. Left lateral fibroid had significant redundant peritoneum that was dissected over the fibroid.  Posterior broad ligament incised up to the uterosacral ligament and left uterine vessels skeletonized.  Uterus was deviated to the left and right fallopian tube was grasped, mesosalpinx was desiccated and cut and then right utero-ovarian and round ligament was desiccated and cut. Anterior broad ligament was incised to create bladder flap, redundant peritoneum over right large subserosal myoma was desiccated and cut. Bladder was pushed away by blunt and sharp dissection with excellent hemostasis. Koh ring impression at cervicovaginal junction was seen well anteriorly. Right posterior broad ligament dissected, right Uterine vessels skeletonized. Right uterine vessels were desiccated and cut. Uterus was deviated to the right and the left uterine vessels were desiccated and cut. Vaginal occluder was inflated. Colpotomy was begun  starting from midline anteriorly coming to the left and right and  then circumferentially staying above the uterosacral ligaments posteriorly. This took some time due to limited mobility of the uterus.  Right lateral myoma was partially cut from the specimen to reduce bulk. Dr Juliene Pina scrubbed back at the perineum and with careful steps, uterus and fibroids were removed in stepwise fashion by vaginal morcellation of the specimen, using sharp dissection with knife or scissors, protecting vaginal wall with Deaver retractors. Specimen was removed, vaginal occluder placed back. Dr Corynn Solberg went to console and Dr Amado Nash scrubbed back in.  Vaginal cut edges were evaluated for hemostasis which was excellent. Irrigation was performed pedicles appeared dry. Robotic instruments switched for needle driver in arm 3 and long tip tissue grasper in arm 1 after passing 0-V-Lock in the abdomen. Cuff closure was completed without problems. Irrigation was performed, all pedicles appeared to be hemostatic.  All instruments were removed. Robot was undocked. Patient was made supine. Lap'scope was reintroduced, ropivacaine instilled. Hemostasis was excellent. All cannulas were removed under vision. Central port fascial closure needed to be done again and was closed well. Skin approximated with subcuticular stitches on 4-0 Vicryl. Dermabond was applied. No vaginal bleeding noted on vaginal exam at the end. All instruments/lap/sponges counts were correct x2.  No complications. Patient tolerated procedure well and was reversed from anesthesia and brought to the PACU stable condition. Foley catheter removed before waking up the patient   Dr Juliene Pina was the surgeon for entire case.

## 2022-12-04 NOTE — Transfer of Care (Signed)
Immediate Anesthesia Transfer of Care Note  Patient: Lauren Castro  Procedure(s) Performed: XI ROBOTIC ASSISTED LAPAROSCOPIC HYSTERECTOMY AND SALPINGECTOMY (Bilateral: Abdomen)  Patient Location: PACU  Anesthesia Type:General  Level of Consciousness: drowsy and patient cooperative  Airway & Oxygen Therapy: Patient Spontanous Breathing and Patient connected to nasal cannula oxygen  Post-op Assessment: Report given to RN and Post -op Vital signs reviewed and stable  Post vital signs: Reviewed and stable  Last Vitals:  Vitals Value Taken Time  BP 115/69 12/04/22 1415  Temp    Pulse 84 12/04/22 1416  Resp 18 12/04/22 1416  SpO2 100 % 12/04/22 1416  Vitals shown include unfiled device data.  Last Pain:  Vitals:   12/04/22 0853  TempSrc: Oral  PainSc: 0-No pain      Patients Stated Pain Goal: 5 (12/04/22 0853)  Complications: No notable events documented.

## 2022-12-05 LAB — SURGICAL PATHOLOGY

## 2022-12-08 ENCOUNTER — Ambulatory Visit: Payer: BC Managed Care – PPO

## 2022-12-08 ENCOUNTER — Encounter (HOSPITAL_BASED_OUTPATIENT_CLINIC_OR_DEPARTMENT_OTHER): Payer: Self-pay | Admitting: Obstetrics & Gynecology

## 2022-12-31 DIAGNOSIS — Z719 Counseling, unspecified: Secondary | ICD-10-CM

## 2023-03-16 DIAGNOSIS — Z719 Counseling, unspecified: Secondary | ICD-10-CM

## 2023-12-16 ENCOUNTER — Encounter: Payer: Self-pay | Admitting: Family Medicine

## 2023-12-16 ENCOUNTER — Ambulatory Visit: Admitting: Audiologist

## 2023-12-16 DIAGNOSIS — H9190 Unspecified hearing loss, unspecified ear: Secondary | ICD-10-CM | POA: Diagnosis present

## 2023-12-16 NOTE — Procedures (Signed)
  [facility] [address] [place], [place] [address] [phone]  AUDIOLOGICAL EVALUATION  Name: Lauren Castro    Status: Outpatient DOB: Jun 29, 1980    Referent: Kip Righter, MD MRN: [id number] Date: 12/16/2023     Diagnosis: Decreased hearing, unspecified laterality   HISTORY: Lauren Castro, 43 y.o., was seen for an audiological evaluation.   Lauren Castro notices increased difficulty hearing in her daily environments, and particularly when there is ambient background noise. She also struggles to hear if someone is speaking behind a glass or partition.  Lauren Castro notes no ear pain, pressure, or fullness.  Lauren Castro reports some history of noise exposure from concerts. There is no report of tinnitus, family history of hearing loss, or history of ear surgery or ear infections. She has a history of BPPV and performs the Epley maneuver as needed.   EVALUATION: Otoscopic inspection reveals clear ear canals with visible tympanic membranes.   Tympanometry was completed to assess middle ear status. Normal, Type A  tympanograms were obtained bilaterally.  Standard audiometric techniques were used to obtain thresholds under high frequency headphones. Speech reception thresholds are 5 dBHL on the right and 0 dBHL on the left using recorded spondee word lists. Word recognition was 100% at 45 dBHL on the right and 100% at 40 dBHL on the left using recorded NU-6 word lists, in quiet. Hearing was found to be within normal limits. Speech in noise testing was completed via Quick[employer]IN. The binaural average SNR loss was 1dB, which is within normal limits.  CONCLUSION:  Lauren Castro has normal peripheral hearing sensitivity bilaterally. Word recognition is excellent in quiet at soft conversational speech levels bilaterally. Speech understanding in noise as measured via QuickSIN was within normal limits. Findings were reviewed with the patient.   RECOMMENDATIONS: Repeat hearing evaluation in 2-3 years  or if change in hearing is noted. Use of communication strategies to improve communication in difficult listening situations.  Audiogram attached. 33 minutes spent testing and counseling.  Delon EMERSON Baumgartner, Au.D., CCC-A Audiologist 12/16/2023  cc: Kip Righter, MD

## 2023-12-21 ENCOUNTER — Ambulatory Visit: Payer: Self-pay

## 2024-01-12 ENCOUNTER — Ambulatory Visit
Admission: RE | Admit: 2024-01-12 | Discharge: 2024-01-12 | Disposition: A | Discharge status: Home / Self Care | Source: Ambulatory Visit | Attending: Family Medicine | Admitting: Family Medicine

## 2024-01-12 DIAGNOSIS — Z1231 Encounter for screening mammogram for malignant neoplasm of breast: Secondary | ICD-10-CM
